# Patient Record
Sex: Female | Born: 1945 | Race: Black or African American | Hispanic: No | Smoking: Current every day smoker
Health system: Southern US, Community
[De-identification: ages and names within clinical notes are randomized; demographics above are authoritative.]

## PROBLEM LIST (undated history)

## (undated) DIAGNOSIS — N289 Disorder of kidney and ureter, unspecified: Secondary | ICD-10-CM

## (undated) DIAGNOSIS — E119 Type 2 diabetes mellitus without complications: Secondary | ICD-10-CM

## (undated) DIAGNOSIS — I10 Essential (primary) hypertension: Secondary | ICD-10-CM

## (undated) DIAGNOSIS — E7800 Pure hypercholesterolemia, unspecified: Secondary | ICD-10-CM

## (undated) HISTORY — PX: CARPAL TUNNEL RELEASE: SHX101

---

## 2014-01-29 ENCOUNTER — Encounter (HOSPITAL_COMMUNITY): Payer: Self-pay | Admitting: Emergency Medicine

## 2014-01-29 ENCOUNTER — Emergency Department (HOSPITAL_COMMUNITY)
Admission: EM | Admit: 2014-01-29 | Discharge: 2014-01-30 | Disposition: A | Discharge status: Home / Self Care | Payer: Federal, State, Local not specified - PPO | Attending: Emergency Medicine | Admitting: Emergency Medicine

## 2014-01-29 DIAGNOSIS — Z720 Tobacco use: Secondary | ICD-10-CM | POA: Diagnosis not present

## 2014-01-29 DIAGNOSIS — I129 Hypertensive chronic kidney disease with stage 1 through stage 4 chronic kidney disease, or unspecified chronic kidney disease: Secondary | ICD-10-CM | POA: Diagnosis not present

## 2014-01-29 DIAGNOSIS — Z79899 Other long term (current) drug therapy: Secondary | ICD-10-CM | POA: Insufficient documentation

## 2014-01-29 DIAGNOSIS — E119 Type 2 diabetes mellitus without complications: Secondary | ICD-10-CM | POA: Diagnosis not present

## 2014-01-29 DIAGNOSIS — N183 Chronic kidney disease, stage 3 (moderate): Secondary | ICD-10-CM | POA: Insufficient documentation

## 2014-01-29 DIAGNOSIS — M25571 Pain in right ankle and joints of right foot: Secondary | ICD-10-CM | POA: Diagnosis not present

## 2014-01-29 DIAGNOSIS — M1000 Idiopathic gout, unspecified site: Secondary | ICD-10-CM | POA: Insufficient documentation

## 2014-01-29 DIAGNOSIS — Z87448 Personal history of other diseases of urinary system: Secondary | ICD-10-CM | POA: Insufficient documentation

## 2014-01-29 DIAGNOSIS — M109 Gout, unspecified: Secondary | ICD-10-CM | POA: Diagnosis present

## 2014-01-29 HISTORY — DX: Essential (primary) hypertension: I10

## 2014-01-29 HISTORY — DX: Pure hypercholesterolemia, unspecified: E78.00

## 2014-01-29 HISTORY — DX: Disorder of kidney and ureter, unspecified: N28.9

## 2014-01-29 HISTORY — DX: Type 2 diabetes mellitus without complications: E11.9

## 2014-01-29 MED ORDER — OXYCODONE-ACETAMINOPHEN 5-325 MG PO TABS
1.0000 | ORAL_TABLET | Freq: Once | ORAL | Status: AC
  Administered 2014-01-29: 1 via ORAL
  Filled 2014-01-29: qty 1

## 2014-01-29 MED ORDER — OXYCODONE-ACETAMINOPHEN 5-325 MG PO TABS: 1.0000 | ORAL_TABLET | Freq: Four times a day (QID) | ORAL | Status: AC | PRN

## 2014-01-29 NOTE — Discharge Instructions (Signed)

## 2014-01-29 NOTE — ED Notes (Signed)
Pt. Reports gout flare up to right foot x3 days. Has taken Colchicine and tramadol with no relief. CNS intact, limited movement due to pain.

## 2014-01-29 NOTE — ED Provider Notes (Signed)
CSN: [id number]     Arrival date & time 01/29/14  2254 History   This chart was scribed for non-physician practitioner working with Mariah Wiley, * by Angelene GiovanniEmmanuella Mensah, ED Scribe. The patient was seen in room TR09C/TR09C and the patient's care was started at 11:32 PM    Chief Complaint  Patient presents with  . Gout   The history is provided by the patient. No language interpreter was used.   HPI Comments: Gar Mariah Wiley is a 68 y.o. female who presents to the Emergency Department complaining of a gout on top of right foot onset 3 days ago. She reports maintain a proper diet and drinking plenty of water and is unsure of what caused this flare up. However, she states that she has been eating lima beans for the past 4 days and suspects that this might be related. She states that she has taken tramadol and colchicine with no relief. She reports a history the same gout last year and was prescribed percocet. She denies any recent falls or injuries to the right ankle. She reports a stage 3 kidney disease.    PCP: Dr. Katrinka BlazingSmith.   Past Medical History  Diagnosis Date  . Diabetes mellitus without complication   . Hypertension   . High cholesterol   . Renal disorder    Past Surgical History  Procedure Laterality Date  . Carpal tunnel release     No family history on file. History  Substance Use Topics  . Smoking status: Current Every Day Smoker -- 0.50 packs/day  . Smokeless tobacco: Not on file  . Alcohol Use: No   OB History   Grav Para Term Preterm Abortions TAB SAB Ect Mult Living                 Review of Systems  Constitutional: Negative for fever and chills.  Musculoskeletal: Positive for myalgias (right foot).      Allergies  Review of patient's allergies indicates no known allergies.  Home Medications   Prior to Admission medications   Not on File   BP 119/61  Pulse 87  Temp(Src) 97.5 F (36.4 C)  Resp 20  SpO2 94% Physical Exam  Nursing note and  vitals reviewed. Constitutional: She is oriented to person, place, and time. She appears well-developed and well-nourished. No distress.  HENT:  Head: Normocephalic and atraumatic.  Eyes: Conjunctivae and EOM are normal.  Neck: Neck supple. No tracheal deviation present.  Cardiovascular: Normal rate.   Intact distal pulses with brisk capillary refill  Pulmonary/Chest: Effort normal. No respiratory distress.  Musculoskeletal: Normal range of motion.  Right foot tender to palpation over the third metatarsal, no bony abnormality or deformity, no erythema, no abscess, no discharge drainage, no evidence of cellulitis, no evidence of septic joint  Neurological: She is alert and oriented to person, place, and time.  Sensation intact  Skin: Skin is warm and dry.  Psychiatric: She has a normal mood and affect. Her behavior is normal.    ED Course  Procedures (including critical care time) DIAGNOSTIC STUDIES: Oxygen Saturation is 94% on RA, adequate by my interpretation.    COORDINATION OF CARE: 11:35 PM- Pt advised of plan for treatment and pt agrees.  Labs Review Labs Reviewed - No data to display  Imaging Review No results found.   EKG Interpretation None      MDM   Final diagnoses:  Acute idiopathic gout, unspecified site    Patient with gout flare. She has had  this pain before. She is tried taking colchicine and tramadol with no relief. She denies any fevers. Denies any injury to the affected area. No evidence of septic joint. Will treat with pain medicine. Continue taking at home medications. Dietary precautions given. Followup with primary care.  I personally performed the services described in this documentation, which was scribed in my presence. The recorded information has been reviewed and is accurate.     Roxy Horsemanobert Annaleah Arata, PA-C 01/30/14 0100

## 2014-01-29 NOTE — ED Notes (Signed)
Pt c/o  Pain in right foot x's 3 days from gout flare up.  Rx's not helping

## 2014-01-30 NOTE — ED Provider Notes (Signed)
Medical screening examination/treatment/procedure(s) were performed by non-physician practitioner and as supervising physician I was immediately available for consultation/collaboration.   EKG Interpretation None        Jyaire Koudelka, MD 01/30/14 0642 

## 2016-03-12 ENCOUNTER — Other Ambulatory Visit: Payer: Self-pay | Admitting: Nurse Practitioner

## 2016-03-12 DIAGNOSIS — E2839 Other primary ovarian failure: Secondary | ICD-10-CM

## 2016-04-02 ENCOUNTER — Ambulatory Visit
Admission: RE | Admit: 2016-04-02 | Discharge: 2016-04-02 | Disposition: A | Discharge status: Home / Self Care | Payer: Federal, State, Local not specified - PPO | Source: Ambulatory Visit | Attending: Nurse Practitioner | Admitting: Nurse Practitioner

## 2016-04-02 DIAGNOSIS — E2839 Other primary ovarian failure: Secondary | ICD-10-CM

## 2016-08-09 ENCOUNTER — Other Ambulatory Visit: Payer: Self-pay | Admitting: Endocrinology

## 2016-08-09 ENCOUNTER — Ambulatory Visit
Admission: RE | Admit: 2016-08-09 | Discharge: 2016-08-09 | Disposition: A | Discharge status: Home / Self Care | Payer: Self-pay | Source: Ambulatory Visit | Attending: Endocrinology | Admitting: Endocrinology

## 2016-08-09 DIAGNOSIS — E042 Nontoxic multinodular goiter: Secondary | ICD-10-CM

## 2016-08-15 ENCOUNTER — Ambulatory Visit
Admission: RE | Admit: 2016-08-15 | Discharge: 2016-08-15 | Disposition: A | Discharge status: Home / Self Care | Payer: Federal, State, Local not specified - PPO | Source: Ambulatory Visit | Attending: Endocrinology | Admitting: Endocrinology

## 2016-08-15 ENCOUNTER — Other Ambulatory Visit (HOSPITAL_COMMUNITY)
Admission: RE | Admit: 2016-08-15 | Discharge: 2016-08-15 | Disposition: A | Discharge status: Home / Self Care | Payer: Federal, State, Local not specified - PPO | Source: Ambulatory Visit | Attending: Radiology | Admitting: Radiology

## 2016-08-15 DIAGNOSIS — E042 Nontoxic multinodular goiter: Secondary | ICD-10-CM

## 2016-08-15 DIAGNOSIS — E041 Nontoxic single thyroid nodule: Secondary | ICD-10-CM | POA: Diagnosis present

## 2017-09-25 IMAGING — US US OUTSIDE FILMS SOFT TISSUE NECK
1 series · 14 of 25 positions shown · non-contrast
Comparison: none

[Series 1: us outside films soft tissue neck · 14 of 57 slices shown]
[im 1/57]
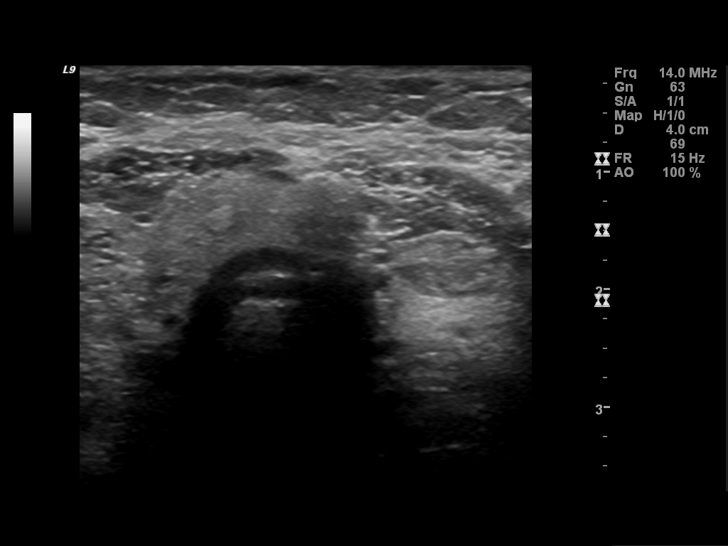
[im 5/57]
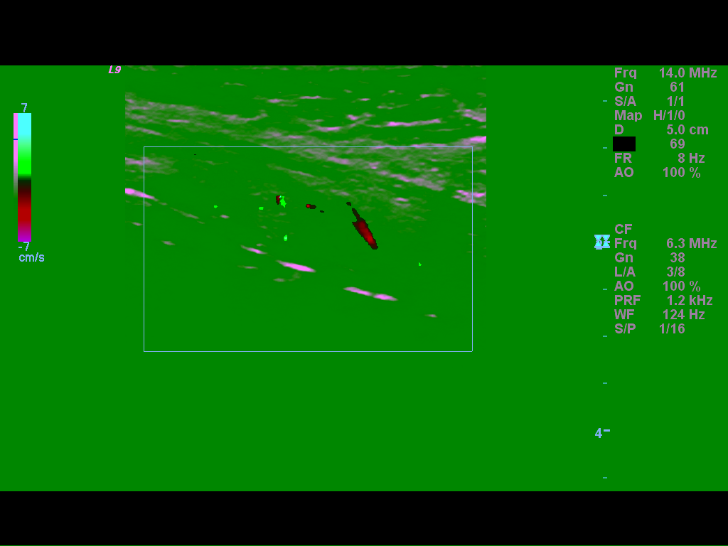
[im 10/57]
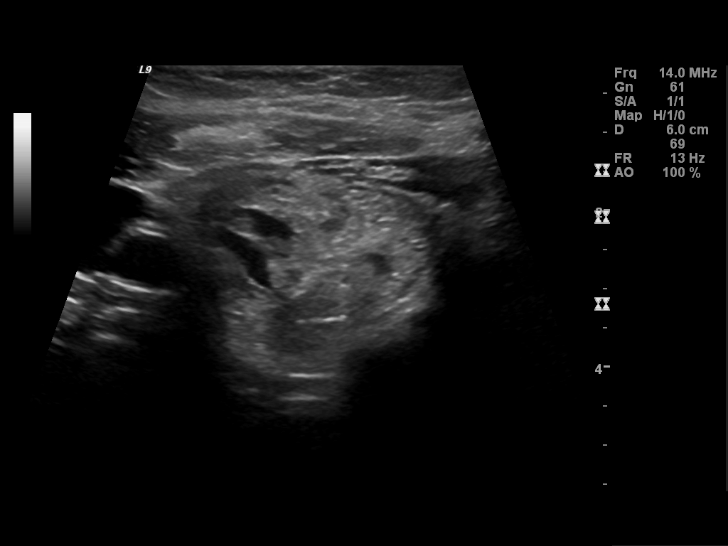
[im 15/57]
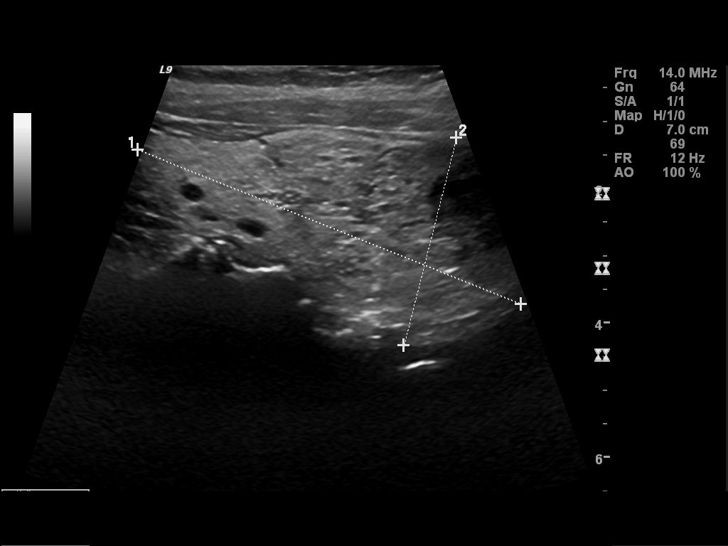
[im 19/57]
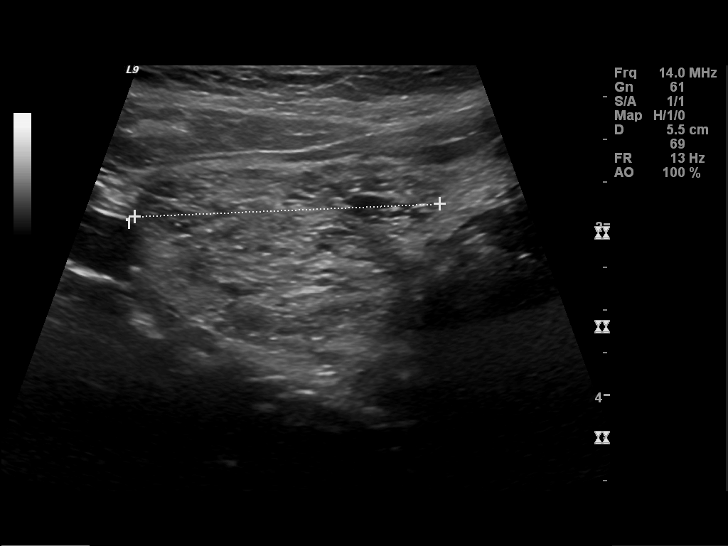
[im 22/57]
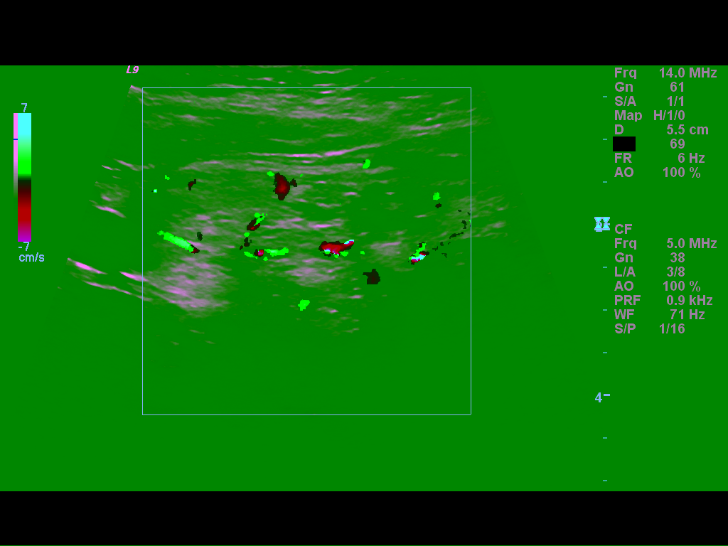
[im 26/57]
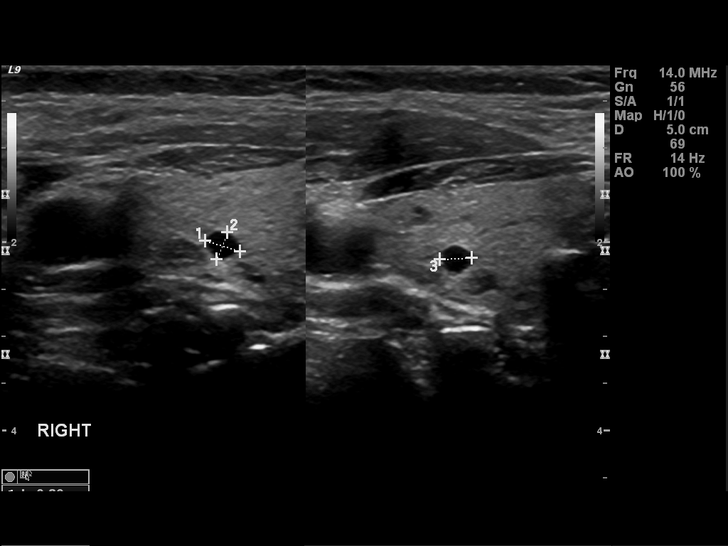
[im 31/57]
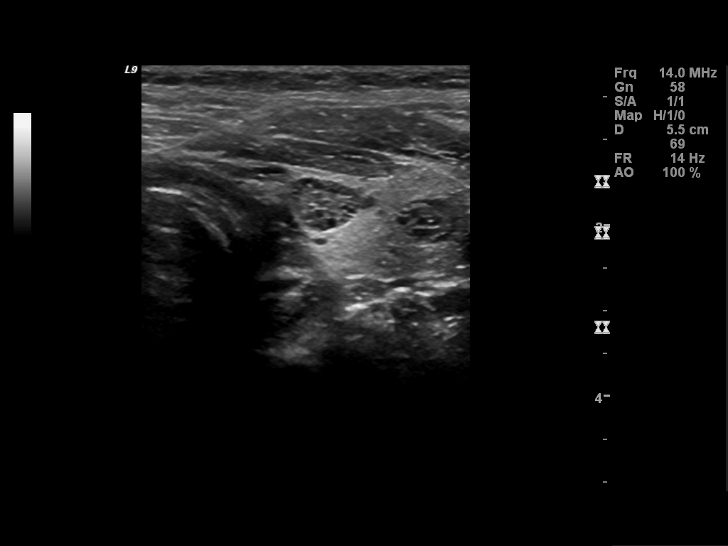
[im 36/57]
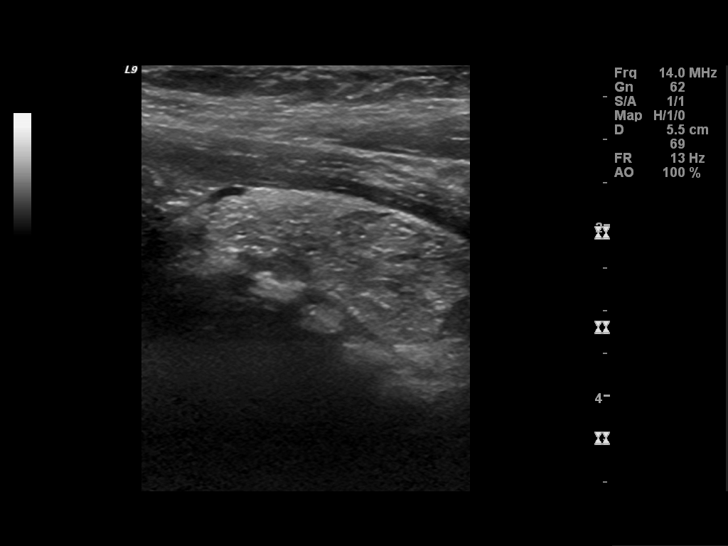
[im 38/57]
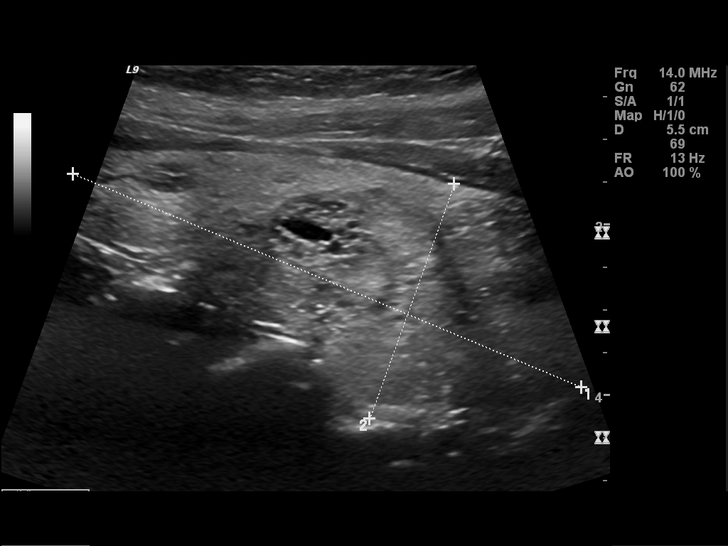
[im 43/57]
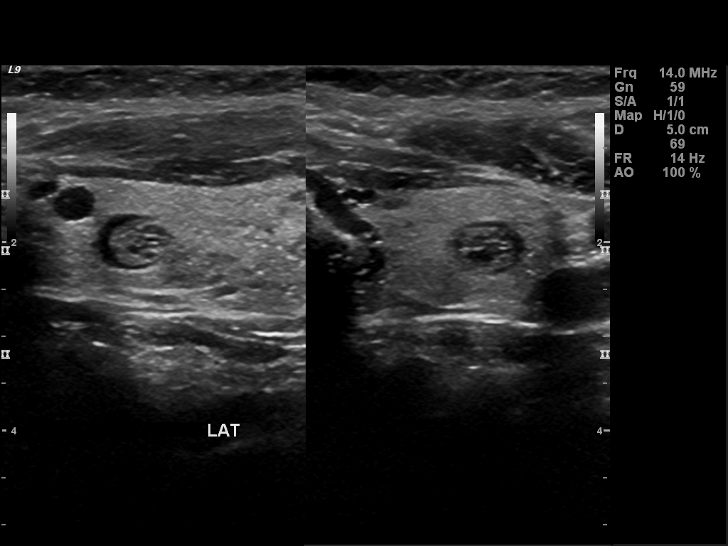
[im 47/57]
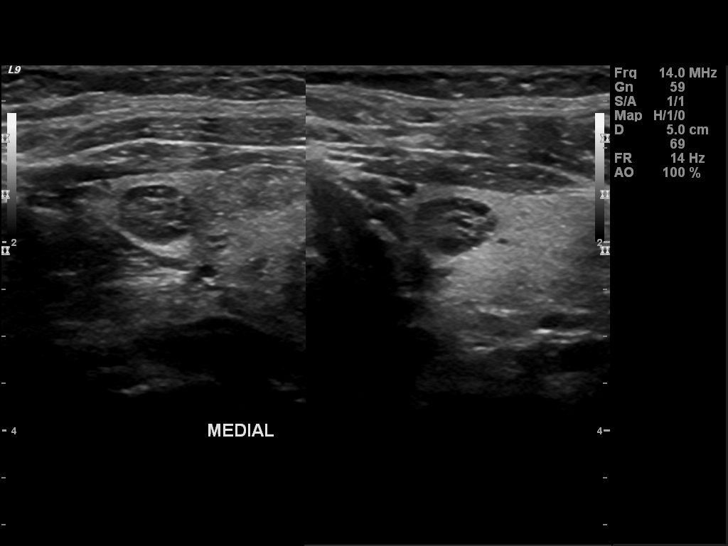
[im 52/57]
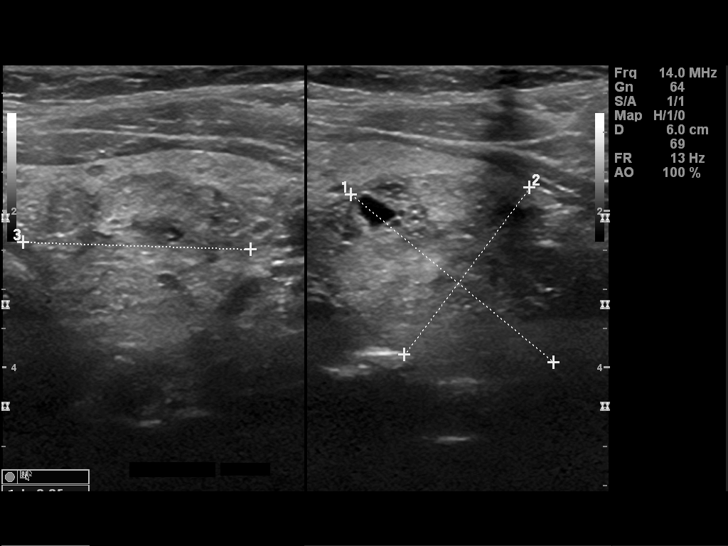
[im 57/57]
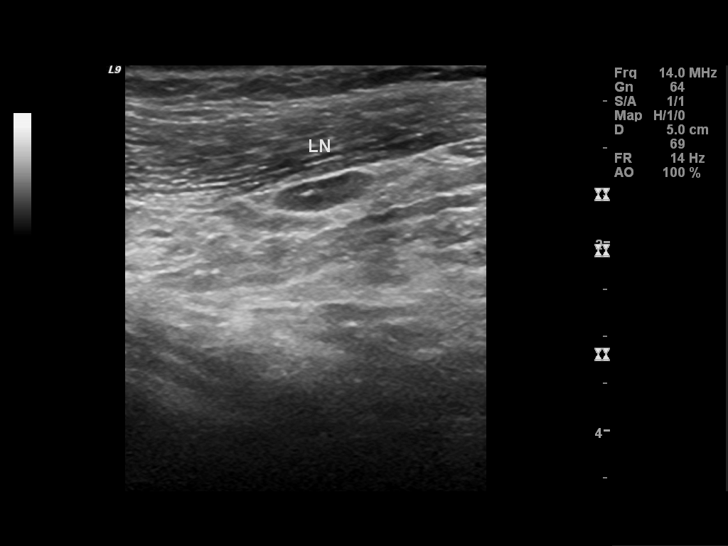

[14 of 25 positions shown; findings below may reference images not displayed]

Canned report from images found in remote index.

Refer to host system for actual result text.

## 2018-04-16 IMAGING — US US THYROID BIOPSY
1 series · 13 of 20 positions shown · non-contrast
Comparison: none

INDICATION: Patient with history of multinodular goiter and thyroid ultrasound
at [HOSPITAL] on 06/20/2016 which revealed dominant nodules in
both right and left lobes. There is a 4 cm nodule in the right lower
pole and a 3.4 cm nodule in the left lower pole . Request now
received for needle aspirate biopsies of the above mentioned
dominant thyroid nodules.

EXAM:
ULTRASOUND GUIDED FINE NEEDLE ASPIRATION BIOPSY OF DOMINANT RIGHT
LOWER POLE AND LEFT LOWER POLE THYROID NODULES
MEDICATIONS:
None.
ANESTHESIA/SEDATION:
None
FLUOROSCOPY TIME:  None
COMPLICATIONS:
None immediate.
TECHNIQUE: Informed written consent was obtained from the patient after a
discussion of the risks, benefits and alternatives to treatment.
Questions regarding the procedure were encouraged and answered. A
timeout was performed prior to the initiation of the procedure.

[Series 1: us thyroid biopsy · 0.08mm/px · 20 acquisitions, 13 frames shown]
[im 1/20]
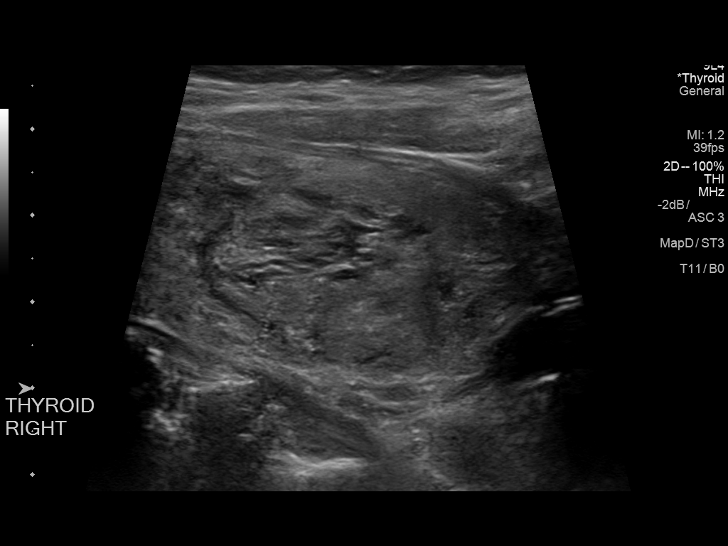
[im 3/20]
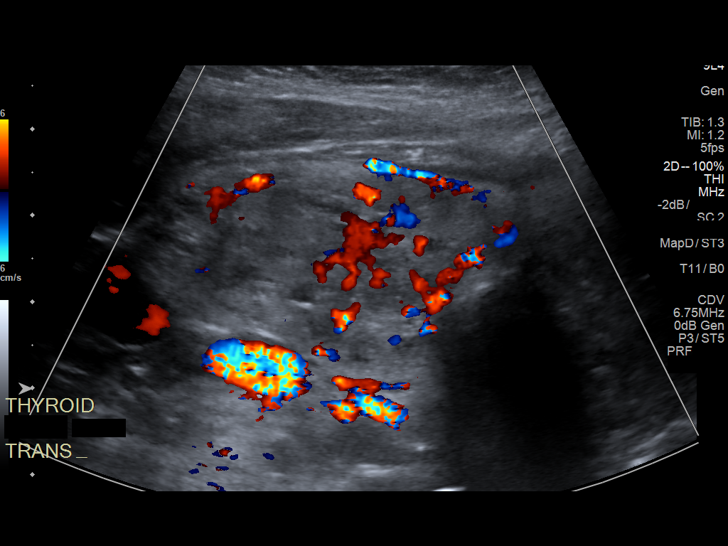
[im 4/20]
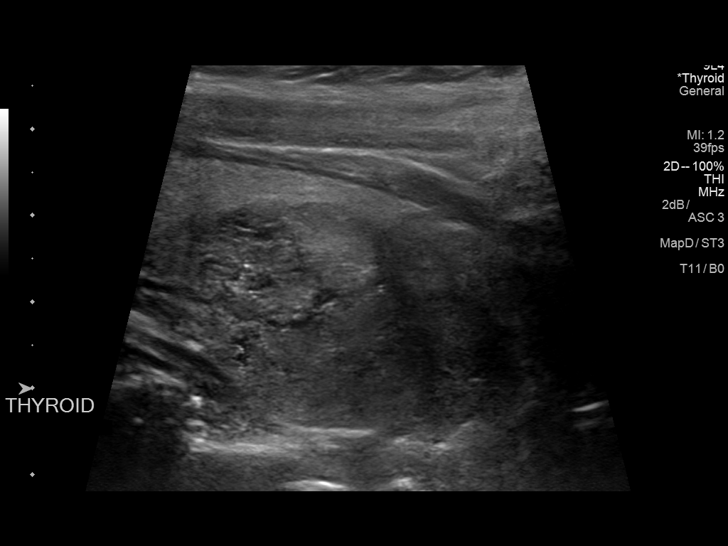
[im 6/20]
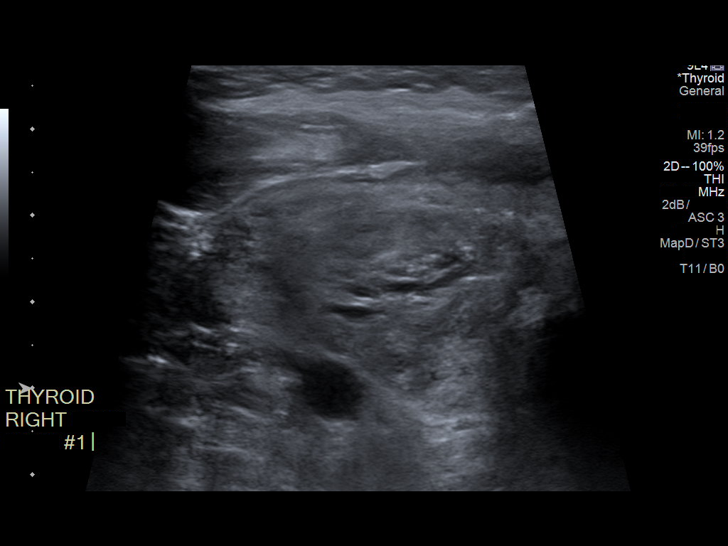
[im 7/20]
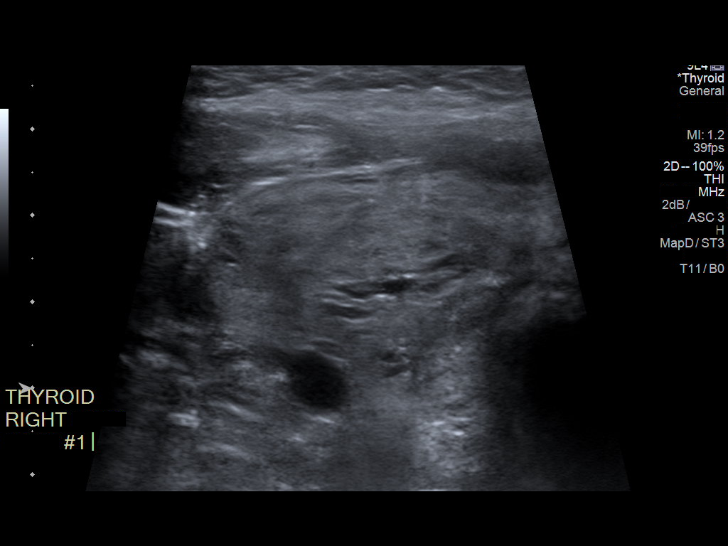
[im 9/20]
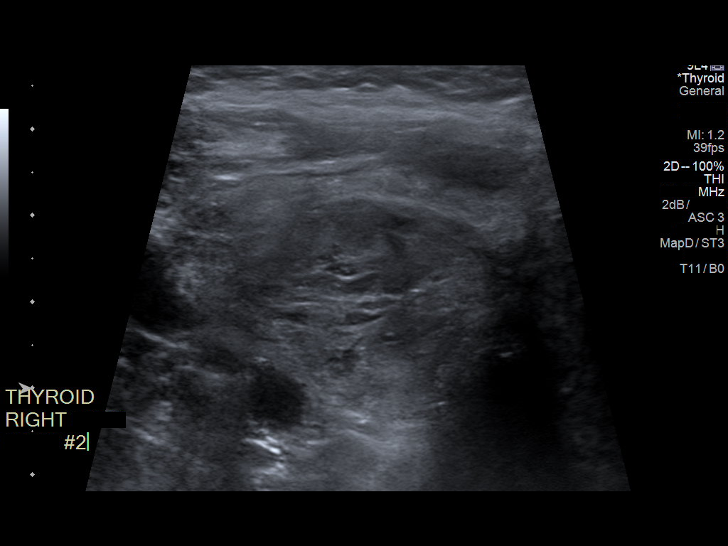
[im 11/20]
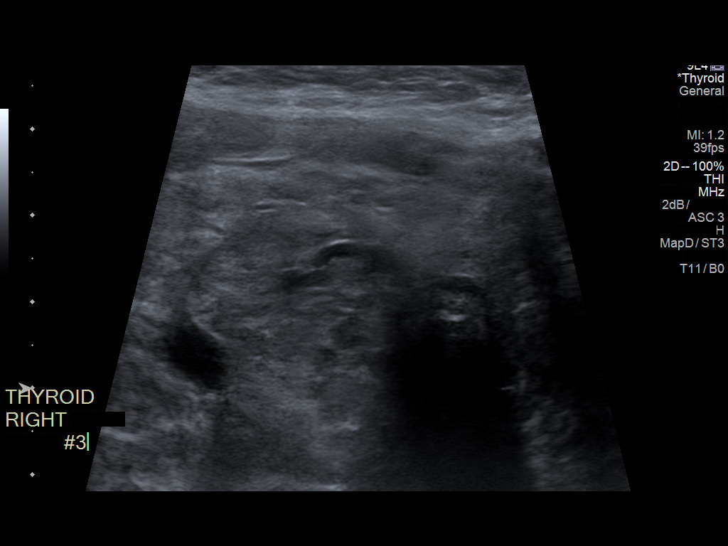
[im 12/20]
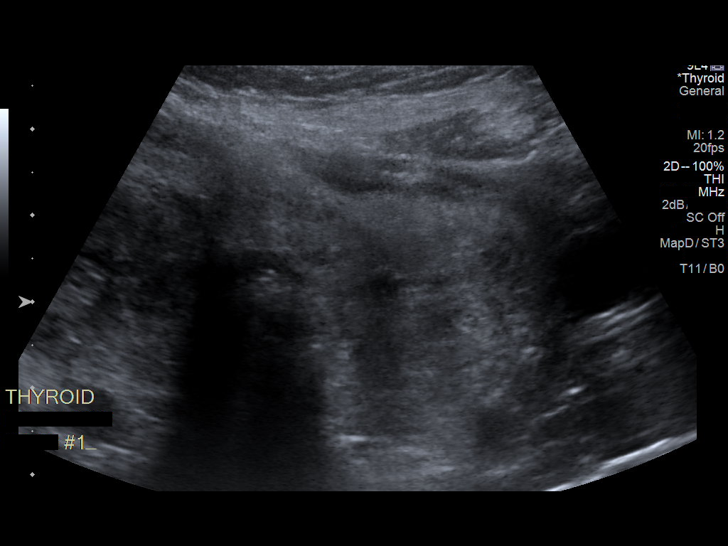
[im 14/20]
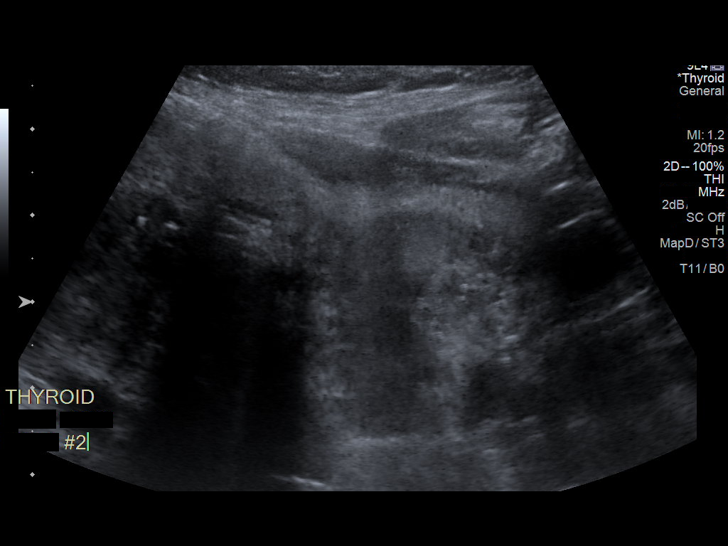
[im 15/20]
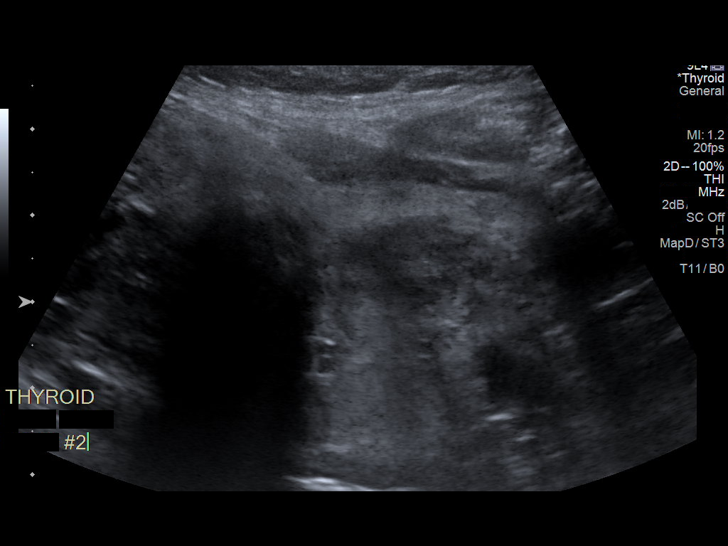
[im 17/20]
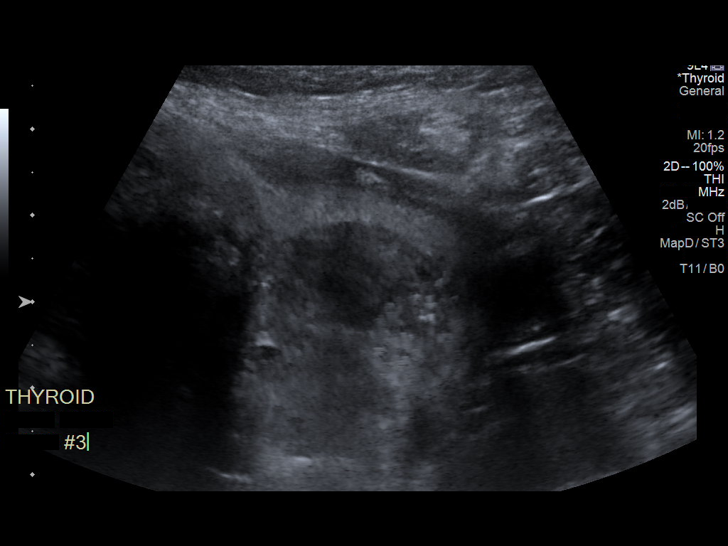
[im 18/20]
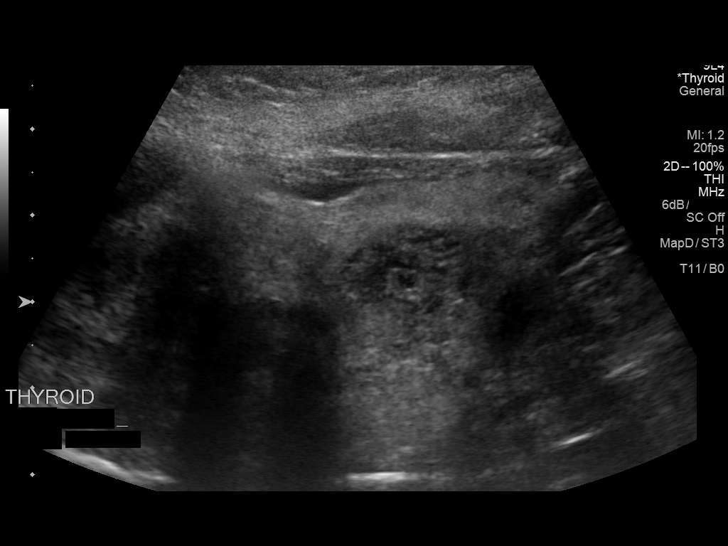
[im 20/20]
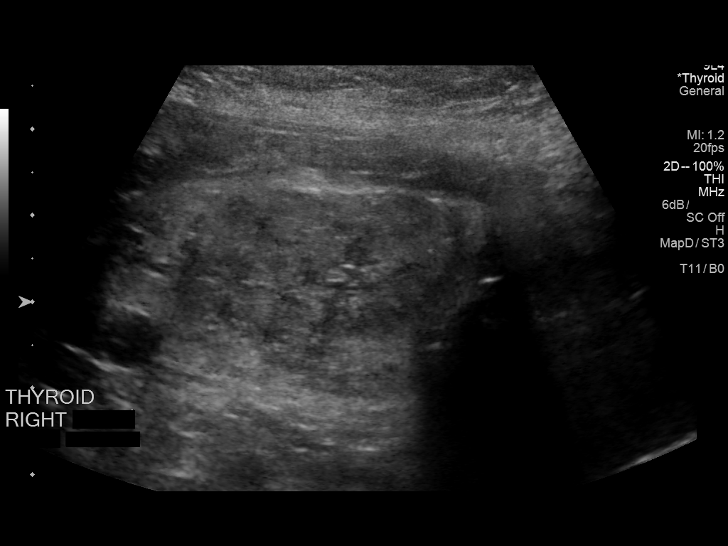

[13 of 20 positions shown; findings below may reference images not displayed]

Pre-procedural ultrasound scanning demonstrated bilateral thyroid
nodules

The procedures were planned. The neck was prepped in the usual
sterile fashion, and a sterile drape was applied covering the
operative field. A timeout was performed prior to the initiation of
the procedure. Local anesthesia was provided with 1% lidocaine.

Under direct ultrasound guidance, 3 FNA biopsies were performed of
the dominant right lower pole thyroid nodule with 25 gauge needles.
The samples were prepared and submitted to pathology.

Under direct ultrasound guidance, 3 FNA biopsies were performed of
the dominant left lower pole thyroid nodule with 25 gauge needles.
The samples were prepared and submitted to pathology.

Limited post procedural scanning was negative for hematoma or
additional complication. Dressings were placed. The patient
tolerated the above procedures procedure well without immediate
postprocedural complication.
IMPRESSION: Technically successful ultrasound guided fine needle aspiration
biopsies of dominant right lower pole and left lower pole thyroid
nodules. Final pathology pending.

## 2019-09-22 ENCOUNTER — Ambulatory Visit: Payer: Federal, State, Local not specified - PPO | Attending: Nurse Practitioner | Admitting: Physical Therapy

## 2019-09-22 ENCOUNTER — Encounter: Payer: Self-pay | Admitting: Physical Therapy

## 2019-09-22 ENCOUNTER — Other Ambulatory Visit: Payer: Self-pay

## 2019-09-22 DIAGNOSIS — M6281 Muscle weakness (generalized): Secondary | ICD-10-CM | POA: Diagnosis present

## 2019-09-22 DIAGNOSIS — G8929 Other chronic pain: Secondary | ICD-10-CM | POA: Insufficient documentation

## 2019-09-22 DIAGNOSIS — M25652 Stiffness of left hip, not elsewhere classified: Secondary | ICD-10-CM | POA: Diagnosis present

## 2019-09-22 DIAGNOSIS — M5442 Lumbago with sciatica, left side: Secondary | ICD-10-CM | POA: Insufficient documentation

## 2019-09-22 DIAGNOSIS — R2689 Other abnormalities of gait and mobility: Secondary | ICD-10-CM | POA: Diagnosis present

## 2019-09-22 NOTE — Patient Instructions (Signed)
Access Code: [email or web address] URL: [email or web address] Date: 09/22/2019 Prepared by: Vernon Prey April Kirstie Peri  Exercises Seated Heel Slide - 1 x daily - 7 x weekly - 2 sets - 10 reps Seated Transversus Abdominis Bracing - 1 x daily - 7 x weekly - 2 sets - 10 reps

## 2019-09-22 NOTE — Therapy (Signed)
[facility] [address] [place], [place], [address] Phone: [phone]   Fax:  [phone]  Physical Therapy Evaluation  Patient Details  Name: Mariah Wiley MRN: [id number] Date of Birth: 1945-08-16 Referring Provider (PT): Maudie Flakes, FNP   Encounter Date: 09/22/2019  PT End of Session - 09/22/19 1705    Visit Number  1    Number of Visits  16    Date for PT Re-Evaluation  11/17/19    PT Start Time  1620    PT Stop Time  1712    PT Time Calculation (min)  52 min       Past Medical History:  Diagnosis Date  . Diabetes mellitus without complication (HCC)   . High cholesterol   . Hypertension   . Renal disorder     Past Surgical History:  Procedure Laterality Date  . CARPAL TUNNEL RELEASE      There were no vitals filed for this visit.   Subjective Assessment - 09/22/19 1621    Subjective  Pt reports she went to spine specialist and was told she had sciatica, arthritis and DDD. Since seeing PCP pt has had bad L side sciatica. Difficulty getting out of bed to the bathroom. Pt states it has calmed down some but it's still there. Pt reports bilateral LE cramping. Pt states spine specialist adjusted pain medication to a muscle relaxer and pt reports that it has been helping. Increased pain with rain. 10/10 pain at it's worst, 4/10 pain at it's best. Pt states she has had to rely on using the back brace for community mobility.    Limitations  Sitting;Standing;Walking;House hold activities    How long can you sit comfortably?  15 minutes    How long can you stand comfortably?  30 sec    How long can you walk comfortably?  100'    Patient Stated Goals  Decrease pain    Currently in Pain?  Yes    Pain Score  5     Pain Location  Back    Pain Orientation  Left    Pain Descriptors / Indicators  Spasm;Squeezing;Shooting;Constant    Pain Type  Chronic pain    Pain Radiating Towards  Left leg    Pain Onset  More than a  month ago    Pain Frequency  Constant    Aggravating Factors   Rain    Pain Relieving Factors  Medication, hot shower         [facility] PT Assessment - 09/22/19 0001      Assessment   Medical Diagnosis  M54.42 (ICD-10-CM) - Lumbago with sciatica, left side    Referring Provider (PT)  Maudie Flakes, FNP    Prior Therapy  Carpal tunnel      Balance Screen   Has the patient fallen in the past 6 months  Yes    How many times?  2   3 months ago and two weeks ago   Has the patient had a decrease in activity level because of a fear of falling?   Yes      Home Environment   Living Environment  Private residence    Living Arrangements  --   Great grandson   Available Help at Discharge  Family    Type of Home  Apartment    Home Access  Level entry    Home Layout  One level    Home Equipment  [place] - single point  Rollator     Prior Function   Level of Independence  Independent with household mobility with device    Vocation  Retired      Metallurgist   Posture/Postural Control  Postural limitations    Postural Limitations  Rounded Shoulders;Forward head;Decreased lumbar lordosis;Flexed trunk;Weight shift right      AROM   Right Hip Flexion  100    Left Hip Flexion  --   Unable to lift hip in seated position   Lumbar Flexion  ~50% limitatoin    Lumbar Extension  ~50% limitation    Lumbar - Right Side Bend  ~60% limitation   limited due to bone   Lumbar - Left Side Bend  ~50% limitation   reports feeling in bone     Strength   Right Hip Flexion  3+/5    Right Hip ABduction  4-/5    Right Hip ADduction  3+/5    Left Hip Flexion  2-/5    Left Hip ABduction  3+/5    Left Hip ADduction  3+/5    Right Knee Flexion  4+/5    Right Knee Extension  4+/5    Left Knee Flexion  3-/5   Limited due to stretch in hamstring/pain   Left Knee Extension  3-/5      Palpation   Palpation comment  TTP hamstring, piriformis and quadratus lumborum                   Objective measurements completed on examination: See above findings.      [facility] Adult PT Treatment/Exercise - 09/22/19 0001      Lumbar Exercises: Stretches   Other Lumbar Stretch Exercise  Heel slide 2 sets x 5 reps   Limited due to pt hamstring pain; assist with knee flexion   Other Lumbar Stretch Exercise  Sitting child's pose      Lumbar Exercises: Seated   Other Seated Lumbar Exercises  TSA activation in seated x 5 reps   reports burn in low back with this exercise     Modalities   Modalities  Electrical Stimulation      Electrical Stimulation   Electrical Stimulation Location  Left low back    Electrical Stimulation Action  IFC    Electrical Stimulation Parameters  to pt tolerance    Electrical Stimulation Goals  Tone;Pain      Manual Therapy   Manual Therapy  Soft tissue mobilization    Soft tissue mobilization  Piriformis and quadratus lumborum             PT Education - 09/22/19 1828    Education Details  Discussed with pt that due to pain, pt with increased guarding and decreased use of L side resulting in increased weakness and pain. Discussed with pt weaning off of back brace slowly for increased use of her lumbar stabilizers.    Person(s) Educated  Patient;Other (comment)   grandchild   Methods  Explanation;Demonstration;Handout    Comprehension  Verbalized understanding;Returned demonstration;Tactile cues required;Verbal cues required;Need further instruction       PT Short Term Goals - 09/22/19 1836      PT SHORT TERM GOAL #1   Title  Pt will be able to complete heel slide with full ROM and no pain    Baseline  Knee ROM 30 to 100 deg without pain    Time  4    Period  Weeks    Status  New    Target  Date  11/17/19        PT Long Term Goals - 09/22/19 1838      PT LONG TERM GOAL #1   Title  Pt will be able to tolerate standing >/= 5 minutes without pain    Baseline  Only tolerates 30 sec    Time  8    Period   Weeks    Status  New    Target Date  11/17/19      PT LONG TERM GOAL #2   Title  Pt will be able to amb >/=200' with rollator without pain    Baseline  Only tolerates ~100'      PT LONG TERM GOAL #3   Title  Pt will be able to sweep her floor for 5 minutes without pain    Baseline  Unable    Time  8    Period  Weeks    Status  New    Target Date  11/17/19      PT LONG TERM GOAL #4   Title  Pt will have improved bilateral hip strength and ROM to at least [facility] for improved ADLs    Baseline  L LE grossly 3- to 3+/5    Time  8    Period  Weeks    Status  New    Target Date  11/17/19      PT LONG TERM GOAL #5   Title  Pt will be independent with her HEP    Time  8    Period  Weeks    Status  New    Target Date  11/17/19             Plan - 09/22/19 1830    Clinical Impression Statement  Pt is a 74 y/o F presenting to OPPT with left side low back pain radiating to left leg. Pt identifies pain and tenderness of hamstring, quadratus lumborum, L piriformis/glute, decreased hip ROM and strength (L worse than R), decreased lumbar ROM with pain, weak core, decreased activity tolerance, and gait dysfunction affecting pt's ability to perform ADLs, IADLs, and community amb. Pt highly reactive with heavy guarding. Pt would benefit from therapy to address these issues to optimize level of function and reduce fall risk.    Personal Factors and Comorbidities  Age;Fitness;Comorbidity 1;Past/Current Experience    Comorbidities  arthritis    Examination-Activity Limitations  Bed Mobility;Bend;Caring for Others;Carry;Dressing;Lift;Locomotion Level;Sit;Squat;Stairs;Stand    Examination-Participation Restrictions  Cleaning;Community Activity;Laundry;Shop;Meal Prep    Stability/Clinical Decision Making  Evolving/Moderate complexity    Clinical Decision Making  Moderate    Rehab Potential  Fair    PT Frequency  2x / week    PT Duration  8 weeks    PT Treatment/Interventions  ADLs/Self Care Home  Management;Aquatic Therapy;Cryotherapy;Electrical Stimulation;Iontophoresis 4mg /ml Dexamethasone;Moist Heat;Ultrasound;Gait training;Stair training;Functional mobility training;Therapeutic activities;Therapeutic exercise;Balance training;Neuromuscular re-education;Patient/family education;Manual techniques;Passive range of motion;Taping    PT Next Visit Plan  Assess pt response to HEP. Continue to progress pt's stretching and strengthening of low back and hip as able. E-stim as needed. May highly benefit from dry needling.    PT Home Exercise Plan  Access Code: [email or web address]    Consulted and Agree with Plan of Care  Patient       Patient will benefit from skilled therapeutic intervention in order to improve the following deficits and impairments:  Abnormal gait, Decreased range of motion, Difficulty walking, Increased fascial restricitons, Decreased endurance, Obesity, Decreased activity tolerance, Pain, Decreased balance, Hypomobility,  Impaired flexibility, Improper body mechanics, Postural dysfunction, Decreased strength, Decreased mobility  Visit Diagnosis: Chronic left-sided low back pain with left-sided sciatica  Muscle weakness (generalized)  Stiffness of left hip, not elsewhere classified  Other abnormalities of gait and mobility     Problem List There are no problems to display for this patient.   [facility] [address] PT, DPT 09/22/2019, 6:48 PM  [facility] [address] [place], [place], [address] Phone: [phone]   Fax:  [phone]  Name: Mariah Wiley MRN: [id number] Date of Birth: Nov 18, 1945

## 2019-09-30 ENCOUNTER — Encounter: Payer: Self-pay | Admitting: Rehabilitative and Restorative Service Providers"

## 2019-09-30 ENCOUNTER — Other Ambulatory Visit: Payer: Self-pay

## 2019-09-30 ENCOUNTER — Ambulatory Visit: Payer: Federal, State, Local not specified - PPO | Admitting: Rehabilitative and Restorative Service Providers"

## 2019-09-30 DIAGNOSIS — R2689 Other abnormalities of gait and mobility: Secondary | ICD-10-CM

## 2019-09-30 DIAGNOSIS — M5442 Lumbago with sciatica, left side: Secondary | ICD-10-CM | POA: Diagnosis not present

## 2019-09-30 DIAGNOSIS — M6281 Muscle weakness (generalized): Secondary | ICD-10-CM

## 2019-09-30 DIAGNOSIS — M25652 Stiffness of left hip, not elsewhere classified: Secondary | ICD-10-CM

## 2019-09-30 NOTE — Therapy (Signed)
[facility] [place], [place], [address] Phone: [phone]   Fax:  [phone]  Physical Therapy Treatment  Patient Details  Name: Mariah Wiley MRN: [id number] Date of Birth: 1946-02-08 Referring Provider (PT): Gregor Hams, FNP   Encounter Date: 09/30/2019   PT End of Session - 09/30/19 1737    Visit Number 2    Number of Visits 16    Date for PT Re-Evaluation 11/17/19    PT Start Time 0505    PT Stop Time 0552    PT Time Calculation (min) 47 min    Activity Tolerance Patient tolerated treatment well;Patient limited by pain    Behavior During Therapy [facility] for tasks assessed/performed           Past Medical History:  Diagnosis Date  . Diabetes mellitus without complication ([facility])   . High cholesterol   . Hypertension   . Renal disorder     Past Surgical History:  Procedure Laterality Date  . CARPAL TUNNEL RELEASE      There were no vitals filed for this visit.   Subjective Assessment - 09/30/19 1709    Subjective I took a muscle relaxer and pain meds around 1230. I am now a 3/10 after the medicine. Radiates down to the knee everytime.    Limitations Sitting;Standing;Walking;House hold activities    How long can you sit comfortably? 15 minutes    How long can you stand comfortably? 30 sec    How long can you walk comfortably? 100'    Patient Stated Goals Decrease pain    Currently in Pain? Yes    Pain Score 3     Pain Location Back    Pain Orientation Left    Pain Descriptors / Indicators Squeezing;Constant    Pain Type Chronic pain    Pain Radiating Towards left leg    Pain Onset More than a month ago    Pain Frequency Constant    Aggravating Factors  weather    Pain Relieving Factors meds, sidelying    Multiple Pain Sites No                             [facility] Adult PT Treatment/Exercise - 09/30/19 0001      Lumbar Exercises: Seated   Other Seated Lumbar Exercises TSA  activation: with march x 12, with LAQ unilat and bil x 10, seated clam shell x 15, with bil shoulder flex/ext x 15; review HEP; with horiz abdct/adduct bil UEs x 15; with forward flexion x 15      Lumbar Exercises: Supine   Other Supine Lumbar Exercises lower trunk rotation limited range of motion x 5 each direction; pt had difficulty transitioning out of and remaining supine; pt requrired max assist to transfer from position      Lumbar Exercises: Sidelying   Other Sidelying Lumbar Exercises sidelying pelvic tilt due to pt stating she prefers sidelying 2x10      Electrical Stimulation   Electrical Stimulation Location Left low back    Electrical Stimulation Action IFC    Electrical Stimulation Parameters to pt tolerance (41.0 cV)    Electrical Stimulation Goals Pain   performed during some of therex                   PT Short Term Goals - 09/22/19 1836      PT SHORT TERM GOAL #1   Title Pt  will be able to complete heel slide with full ROM and no pain    Baseline Knee ROM 30 to 100 deg without pain    Time 4    Period Weeks    Status New    Target Date 11/17/19             PT Long Term Goals - 09/22/19 1838      PT LONG TERM GOAL #1   Title Pt will be able to tolerate standing >/= 5 minutes without pain    Baseline Only tolerates 30 sec    Time 8    Period Weeks    Status New    Target Date 11/17/19      PT LONG TERM GOAL #2   Title Pt will be able to amb >/=200' with rollator without pain    Baseline Only tolerates ~100'      PT LONG TERM GOAL #3   Title Pt will be able to sweep her floor for 5 minutes without pain    Baseline Unable    Time 8    Period Weeks    Status New    Target Date 11/17/19      PT LONG TERM GOAL #4   Title Pt will have improved bilateral hip strength and ROM to at least [facility] for improved ADLs    Baseline L LE grossly 3- to 3+/5    Time 8    Period Weeks    Status New    Target Date 11/17/19      PT LONG TERM GOAL #5    Title Pt will be independent with her HEP    Time 8    Period Weeks    Status New    Target Date 11/17/19                 Plan - 09/30/19 1737    Clinical Impression Statement Pt is limited by pain and hindered in positioning due to being unable to lay supine and difficulty with transitioning from supine to sidelying. Pt was able to tolerate activities better in sidelying and sitting. Therapy focus on maintaining abdominal bracing while performing activities for further challenge. Pt would benefit from further PT for core stability activities and flexibility exercises as tolerated. Her pain and high guarding with activities hinders participation.    PT Frequency 2x / week    PT Duration 8 weeks    PT Treatment/Interventions ADLs/Self Care Home Management;Aquatic Therapy;Cryotherapy;Electrical Stimulation;Iontophoresis 4mg /ml Dexamethasone;Moist Heat;Ultrasound;Gait training;Stair training;Functional mobility training;Therapeutic activities;Therapeutic exercise;Balance training;Neuromuscular re-education;Patient/family education;Manual techniques;Passive range of motion;Taping    PT Next Visit Plan Continue to progress pt's stretching and strengthening of low back and hip as able. E-stim as needed. May highly benefit from dry needling.    PT Home Exercise Plan Access Code: [email or web address]           Patient will benefit from skilled therapeutic intervention in order to improve the following deficits and impairments:  Abnormal gait, Decreased range of motion, Difficulty walking, Increased fascial restricitons, Decreased endurance, Obesity, Decreased activity tolerance, Pain, Decreased balance, Hypomobility, Impaired flexibility, Improper body mechanics, Postural dysfunction, Decreased strength, Decreased mobility  Visit Diagnosis: Chronic left-sided low back pain with left-sided sciatica  Muscle weakness (generalized)  Stiffness of left hip, not elsewhere classified  Other abnormalities of  gait and mobility     Problem List There are no problems to display for this patient.   , PT 09/30/2019, 5:58 PM  [facility] [address] [place], [place], [address] Phone: [phone]   Fax:  [phone]  Name: Mariah Wiley MRN: [id number] Date of Birth: 1945-10-05

## 2019-10-06 ENCOUNTER — Ambulatory Visit: Payer: Federal, State, Local not specified - PPO | Admitting: Physical Therapy

## 2019-10-06 ENCOUNTER — Other Ambulatory Visit: Payer: Self-pay

## 2019-10-06 DIAGNOSIS — R2689 Other abnormalities of gait and mobility: Secondary | ICD-10-CM

## 2019-10-06 DIAGNOSIS — M5442 Lumbago with sciatica, left side: Secondary | ICD-10-CM | POA: Diagnosis not present

## 2019-10-06 DIAGNOSIS — M6281 Muscle weakness (generalized): Secondary | ICD-10-CM

## 2019-10-06 DIAGNOSIS — M25652 Stiffness of left hip, not elsewhere classified: Secondary | ICD-10-CM

## 2019-10-06 NOTE — Therapy (Signed)
[facility] [address] [place], [place], [address] Phone: [phone]   Fax:  [phone]  Physical Therapy Treatment  Patient Details  Name: Mariah Wiley MRN: [id number] Date of Birth: Sep 10, 1945 Referring Provider (PT): Maudie Flakes, FNP   Encounter Date: 10/06/2019   PT End of Session - 10/06/19 1429    Visit Number 3    Number of Visits 16    Date for PT Re-Evaluation 11/17/19    PT Start Time 1410    PT Stop Time 1438    PT Time Calculation (min) 28 min    Activity Tolerance Patient tolerated treatment well;Patient limited by pain    Behavior During Therapy [facility] for tasks assessed/performed           Past Medical History:  Diagnosis Date  . Diabetes mellitus without complication (HCC)   . High cholesterol   . Hypertension   . Renal disorder     Past Surgical History:  Procedure Laterality Date  . CARPAL TUNNEL RELEASE      There were no vitals filed for this visit.   Subjective Assessment - 10/06/19 1416    Subjective Pt reports increased soreness from doing exercises in the pool. Pt refuses any treatment besides using e-stim. Pt refuses to be touched for any manual therapy and refuses gentle stretching. Pt agreeable to use up a visit for e-stim.    Limitations Sitting;Standing;Walking;House hold activities    How long can you sit comfortably? 15 minutes    How long can you stand comfortably? 30 sec    How long can you walk comfortably? 100'    Patient Stated Goals Decrease pain    Currently in Pain? Yes    Pain Score 9     Pain Location Back    Pain Onset More than a month ago                             [facility] Adult PT Treatment/Exercise - 10/06/19 0001      Modalities   Modalities Iontophoresis      Electrical Stimulation   Electrical Stimulation Location Low back and Lt glute    Electrical Stimulation Action premod    Electrical Stimulation Parameters to pt tolerance     Electrical Stimulation Goals Pain;Tone      Iontophoresis   Type of Iontophoresis Dexamethasone    Location low back    Dose 80 mA/min    Time 4 hrs                  PT Education - 10/06/19 1427    Education Details Discussed with pt that e-stim is not a very skilled procedure and discussed home TENs unit. Discussed with pt concerns about using up a full visit just for e-stim and that it would be okay to cancel next time if she is this sore.    Person(s) Educated Patient    Methods Explanation;Demonstration;Handout    Comprehension Verbalized understanding;Returned demonstration;Tactile cues required            PT Short Term Goals - 09/22/19 1836      PT SHORT TERM GOAL #1   Title Pt will be able to complete heel slide with full ROM and no pain    Baseline Knee ROM 30 to 100 deg without pain    Time 4    Period Weeks    Status New    Target  Date 11/17/19             PT Long Term Goals - 09/22/19 1838      PT LONG TERM GOAL #1   Title Pt will be able to tolerate standing >/= 5 minutes without pain    Baseline Only tolerates 30 sec    Time 8    Period Weeks    Status New    Target Date 11/17/19      PT LONG TERM GOAL #2   Title Pt will be able to amb >/=200' with rollator without pain    Baseline Only tolerates ~100'      PT LONG TERM GOAL #3   Title Pt will be able to sweep her floor for 5 minutes without pain    Baseline Unable    Time 8    Period Weeks    Status New    Target Date 11/17/19      PT LONG TERM GOAL #4   Title Pt will have improved bilateral hip strength and ROM to at least [facility] for improved ADLs    Baseline L LE grossly 3- to 3+/5    Time 8    Period Weeks    Status New    Target Date 11/17/19      PT LONG TERM GOAL #5   Title Pt will be independent with her HEP    Time 8    Period Weeks    Status New    Target Date 11/17/19                 Plan - 10/06/19 1440    Clinical Impression Statement Pt presents to OPPT  with increased soreness this session from going to the pool. Pt notes that she did her HEP and stretches last night and refuses any other treatment this session besides e-stim and ionto. Pt provided education for TENs unit at home and discussed that passive treatments does not best utilize her therapy visits -- pt verbalizes understanding but would still like to use a therapy visit. Further discussed with pt, aquatic therapy and pt expresses interest in this. Pt seems very appropriate for this and would be highly beneficial for her.    Personal Factors and Comorbidities Age;Fitness;Comorbidity 1;Past/Current Experience    Comorbidities arthritis    Examination-Activity Limitations Bed Mobility;Bend;Caring for Others;Carry;Dressing;Lift;Locomotion Level;Sit;Squat;Stairs;Stand    Examination-Participation Restrictions Cleaning;Community Activity;Laundry;Shop;Meal Prep    Stability/Clinical Decision Making Evolving/Moderate complexity    Clinical Decision Making Moderate    PT Frequency 2x / week    PT Duration 8 weeks    PT Treatment/Interventions ADLs/Self Care Home Management;Aquatic Therapy;Cryotherapy;Electrical Stimulation;Iontophoresis 4mg /ml Dexamethasone;Moist Heat;Ultrasound;Gait training;Stair training;Functional mobility training;Therapeutic activities;Therapeutic exercise;Balance training;Neuromuscular re-education;Patient/family education;Manual techniques;Passive range of motion;Taping    PT Next Visit Plan Continue to progress pt's stretching and strengthening of low back and hip as able. E-stim as needed. May benefit from aquatic therapy    PT Home Exercise Plan Access Code: [email or web address]    Recommended Other Services Aquatic therapy    Consulted and Agree with Plan of Care Patient           Patient will benefit from skilled therapeutic intervention in order to improve the following deficits and impairments:  Abnormal gait, Decreased range of motion, Difficulty walking, Increased fascial  restricitons, Decreased endurance, Obesity, Decreased activity tolerance, Pain, Decreased balance, Hypomobility, Impaired flexibility, Improper body mechanics, Postural dysfunction, Decreased strength, Decreased mobility  Visit Diagnosis: Chronic left-sided low back pain with left-sided sciatica  Muscle weakness (generalized)  Stiffness of left hip, not elsewhere classified  Other abnormalities of gait and mobility     Problem List There are no problems to display for this patient.   [facility] [address] PT, DPT 10/06/2019, 2:45 PM  [facility] [address] [place], [place], [address] Phone: [phone]   Fax:  [phone]  Name: Mariah Wiley MRN: [id number] Date of Birth: 1945/10/29

## 2019-10-08 ENCOUNTER — Other Ambulatory Visit: Payer: Self-pay

## 2019-10-08 ENCOUNTER — Ambulatory Visit: Payer: Federal, State, Local not specified - PPO | Admitting: Physical Therapy

## 2019-10-08 DIAGNOSIS — R2689 Other abnormalities of gait and mobility: Secondary | ICD-10-CM

## 2019-10-08 DIAGNOSIS — M5442 Lumbago with sciatica, left side: Secondary | ICD-10-CM | POA: Diagnosis not present

## 2019-10-08 DIAGNOSIS — M6281 Muscle weakness (generalized): Secondary | ICD-10-CM

## 2019-10-08 DIAGNOSIS — G8929 Other chronic pain: Secondary | ICD-10-CM

## 2019-10-08 DIAGNOSIS — M25652 Stiffness of left hip, not elsewhere classified: Secondary | ICD-10-CM

## 2019-10-08 NOTE — Therapy (Signed)
[facility] [address] [place], [place], [address] Phone: [phone]   Fax:  [phone]  Physical Therapy Treatment  Patient Details  Name: Mariah Wiley MRN: [id number] Date of Birth: 04-05-1946 Referring Provider (PT): Maudie Flakes, FNP   Encounter Date: 10/08/2019   PT End of Session - 10/08/19 1730    Visit Number 4    Number of Visits 16    Date for PT Re-Evaluation 11/17/19    PT Start Time 1705    PT Stop Time 1753    PT Time Calculation (min) 48 min    Activity Tolerance Patient tolerated treatment well;Patient limited by pain    Behavior During Therapy [facility] for tasks assessed/performed           Past Medical History:  Diagnosis Date  . Diabetes mellitus without complication (HCC)   . High cholesterol   . Hypertension   . Renal disorder     Past Surgical History:  Procedure Laterality Date  . CARPAL TUNNEL RELEASE      There were no vitals filed for this visit.   Subjective Assessment - 10/08/19 1749    Subjective Pt comes into clinic with no reports of pain and feeling better than last session. Pt states ionto patch worked well.    Limitations Sitting;Standing;Walking;House hold activities    How long can you sit comfortably? 15 minutes    How long can you stand comfortably? 30 sec    How long can you walk comfortably? 100'    Patient Stated Goals Decrease pain    Currently in Pain? No/denies    Pain Onset More than a month ago                             [facility] Adult PT Treatment/Exercise - 10/08/19 0001      Lumbar Exercises: Seated   Other Seated Lumbar Exercises TSA activation: with march x 10, with LAQ unilat x 10, seated clam shell x 10 with red tband, hip add with ball x 10 reps, alternating bilat shoulder flexion x 10 reps; alternating bilat shoulder & LAQ x 10 reps    Other Seated Lumbar Exercises Heel slides x 10 reps, heel/toe raises x 10 reps,       Shoulder  Exercises: Seated   Retraction Strengthening;10 reps    Other Seated Exercises Cervical retraction x 10 reps      Modalities   Modalities Iontophoresis      Electrical Stimulation   Electrical Stimulation Location Low back    Electrical Stimulation Action IFC    Electrical Stimulation Parameters to pt tolerance    Electrical Stimulation Goals Pain      Iontophoresis   Type of Iontophoresis Dexamethasone    Location low back    Dose 80 mA/min    Time 4 hrs                    PT Short Term Goals - 09/22/19 1836      PT SHORT TERM GOAL #1   Title Pt will be able to complete heel slide with full ROM and no pain    Baseline Knee ROM 30 to 100 deg without pain    Time 4    Period Weeks    Status New    Target Date 11/17/19             PT Long Term Goals - 09/22/19  1838      PT LONG TERM GOAL #1   Title Pt will be able to tolerate standing >/= 5 minutes without pain    Baseline Only tolerates 30 sec    Time 8    Period Weeks    Status New    Target Date 11/17/19      PT LONG TERM GOAL #2   Title Pt will be able to amb >/=200' with rollator without pain    Baseline Only tolerates ~100'      PT LONG TERM GOAL #3   Title Pt will be able to sweep her floor for 5 minutes without pain    Baseline Unable    Time 8    Period Weeks    Status New    Target Date 11/17/19      PT LONG TERM GOAL #4   Title Pt will have improved bilateral hip strength and ROM to at least [facility] for improved ADLs    Baseline L LE grossly 3- to 3+/5    Time 8    Period Weeks    Status New    Target Date 11/17/19      PT LONG TERM GOAL #5   Title Pt will be independent with her HEP    Time 8    Period Weeks    Status New    Target Date 11/17/19                 Plan - 10/08/19 1753    Clinical Impression Statement Treatment performed in sitting position with focus on continued abdominal bracing only due to pt's limited tolerance to other positions. Pt able to complete  heel slide without pain. Progressing pt to perform full LAQ and alternating marching without back pain/irritation.    Personal Factors and Comorbidities Age;Fitness;Comorbidity 1;Past/Current Experience    Comorbidities arthritis    Examination-Activity Limitations Bed Mobility;Bend;Caring for Others;Carry;Dressing;Lift;Locomotion Level;Sit;Squat;Stairs;Stand    Examination-Participation Restrictions Cleaning;Community Activity;Laundry;Shop;Meal Prep    Stability/Clinical Decision Making Evolving/Moderate complexity    Rehab Potential Fair    PT Frequency 2x / week    PT Duration 8 weeks    PT Treatment/Interventions ADLs/Self Care Home Management;Aquatic Therapy;Cryotherapy;Electrical Stimulation;Iontophoresis 4mg /ml Dexamethasone;Moist Heat;Ultrasound;Gait training;Stair training;Functional mobility training;Therapeutic activities;Therapeutic exercise;Balance training;Neuromuscular re-education;Patient/family education;Manual techniques;Passive range of motion;Taping    PT Next Visit Plan Continue to progress pt's stretching and strengthening of low back and hip as able. E-stim as needed. May benefit from aquatic therapy. Consider dynadisk if pt able to tolerate.    PT Home Exercise Plan Access Code: [email or web address]    Consulted and Agree with Plan of Care Patient           Patient will benefit from skilled therapeutic intervention in order to improve the following deficits and impairments:  Abnormal gait, Decreased range of motion, Difficulty walking, Increased fascial restricitons, Decreased endurance, Obesity, Decreased activity tolerance, Pain, Decreased balance, Hypomobility, Impaired flexibility, Improper body mechanics, Postural dysfunction, Decreased strength, Decreased mobility  Visit Diagnosis: Chronic left-sided low back pain with left-sided sciatica  Muscle weakness (generalized)  Stiffness of left hip, not elsewhere classified  Other abnormalities of gait and  mobility     Problem List There are no problems to display for this patient.   [facility] [address] PT, DPT 10/08/2019, 5:59 PM  [facility] [address] [place], [place], [address] Phone: [phone]   Fax:  [phone]  Name: Mariah Wiley MRN: [id number] Date of Birth: Feb 13, 1946

## 2019-10-13 ENCOUNTER — Ambulatory Visit: Payer: Federal, State, Local not specified - PPO | Admitting: Physical Therapy

## 2019-10-13 ENCOUNTER — Other Ambulatory Visit: Payer: Self-pay

## 2019-10-13 DIAGNOSIS — R2689 Other abnormalities of gait and mobility: Secondary | ICD-10-CM

## 2019-10-13 DIAGNOSIS — M25652 Stiffness of left hip, not elsewhere classified: Secondary | ICD-10-CM

## 2019-10-13 DIAGNOSIS — M5442 Lumbago with sciatica, left side: Secondary | ICD-10-CM | POA: Diagnosis not present

## 2019-10-13 DIAGNOSIS — M6281 Muscle weakness (generalized): Secondary | ICD-10-CM

## 2019-10-13 DIAGNOSIS — G8929 Other chronic pain: Secondary | ICD-10-CM

## 2019-10-13 NOTE — Therapy (Signed)
[facility] [address] [place], [place], [address] Phone: [phone]   Fax:  [phone]  Physical Therapy Treatment  Patient Details  Name: Mariah Wiley MRN: [id number] Date of Birth: 28-Apr-1945 Referring Provider (PT): Maudie Flakes, FNP   Encounter Date: 10/13/2019   PT End of Session - 10/13/19 1739    Visit Number 5    Number of Visits 16    Date for PT Re-Evaluation 11/17/19    PT Start Time 1705    PT Stop Time 1739    PT Time Calculation (min) 34 min    Activity Tolerance Patient limited by pain    Behavior During Therapy [facility] for tasks assessed/performed           Past Medical History:  Diagnosis Date  . Diabetes mellitus without complication (HCC)   . High cholesterol   . Hypertension   . Renal disorder     Past Surgical History:  Procedure Laterality Date  . CARPAL TUNNEL RELEASE      There were no vitals filed for this visit.   Subjective Assessment - 10/13/19 1712    Subjective Pt reports that ionto patch made her break out last session. Pt reports she was able to walk in the pool on Sunday and it felt okay. Pt states she has increased fatigue due to poor sleeping from mosquito/bug bites    Limitations Sitting;Standing;Walking;House hold activities    How long can you sit comfortably? 15 minutes    How long can you stand comfortably? 30 sec    How long can you walk comfortably? 100'    Patient Stated Goals Decrease pain    Pain Onset More than a month ago                             [facility] Adult PT Treatment/Exercise - 10/13/19 0001      Lumbar Exercises: Standing   Other Standing Lumbar Exercises standing into trunk in neutral x 5 reps      Lumbar Exercises: Seated   Sit to Stand 10 reps    Sit to Stand Limitations chair push-ups    Other Seated Lumbar Exercises TSA activation: with march x 10, with LAQ unilat x 10, seated clam shell x 10 with red tband, hip add with  ball x 10 reps, alternating bilat shoulder flexion x 10 reps; alternating bilat shoulder & LAQ x 10 reps, clamshell x 10 reps with red tband, ER and IR iso x 10 reps    sitting on airex   Other Seated Lumbar Exercises Heel raise 2 sets x 10 reps, lean forward/back x 10 reps                  PT Education - 10/13/19 1738    Education Details Discussed a few exercises in the pool.    Person(s) Educated Patient    Methods Explanation;Demonstration;Handout    Comprehension Verbalized understanding;Returned demonstration;Tactile cues required            PT Short Term Goals - 09/22/19 1836      PT SHORT TERM GOAL #1   Title Pt will be able to complete heel slide with full ROM and no pain    Baseline Knee ROM 30 to 100 deg without pain    Time 4    Period Weeks    Status New    Target Date 11/17/19  PT Long Term Goals - 09/22/19 1838      PT LONG TERM GOAL #1   Title Pt will be able to tolerate standing >/= 5 minutes without pain    Baseline Only tolerates 30 sec    Time 8    Period Weeks    Status New    Target Date 11/17/19      PT LONG TERM GOAL #2   Title Pt will be able to amb >/=200' with rollator without pain    Baseline Only tolerates ~100'      PT LONG TERM GOAL #3   Title Pt will be able to sweep her floor for 5 minutes without pain    Baseline Unable    Time 8    Period Weeks    Status New    Target Date 11/17/19      PT LONG TERM GOAL #4   Title Pt will have improved bilateral hip strength and ROM to at least [facility] for improved ADLs    Baseline L LE grossly 3- to 3+/5    Time 8    Period Weeks    Status New    Target Date 11/17/19      PT LONG TERM GOAL #5   Title Pt will be independent with her HEP    Time 8    Period Weeks    Status New    Target Date 11/17/19                 Plan - 10/13/19 1740    Clinical Impression Statement Treatment focused on progressing bilat LE, lumbar and core strengthening with pt sitting  on compliant surface. Progressing pt to transition/tolerate standing exercises. LAQ continues to "burn" pt's back.    Personal Factors and Comorbidities Age;Fitness;Comorbidity 1;Past/Current Experience    Comorbidities arthritis    Examination-Activity Limitations Bed Mobility;Bend;Caring for Others;Carry;Dressing;Lift;Locomotion Level;Sit;Squat;Stairs;Stand    Examination-Participation Restrictions Cleaning;Community Activity;Laundry;Shop;Meal Prep    Stability/Clinical Decision Making Evolving/Moderate complexity    Rehab Potential Fair    PT Frequency 2x / week    PT Duration 8 weeks    PT Treatment/Interventions ADLs/Self Care Home Management;Aquatic Therapy;Cryotherapy;Electrical Stimulation;Iontophoresis 4mg /ml Dexamethasone;Moist Heat;Ultrasound;Gait training;Stair training;Functional mobility training;Therapeutic activities;Therapeutic exercise;Balance training;Neuromuscular re-education;Patient/family education;Manual techniques;Passive range of motion;Taping    PT Next Visit Plan Continue to progress pt's stretching and strengthening of core, low back and hip as able. E-stim as needed. Progress pt into standing exercises as able.    PT Home Exercise Plan Access Code: [email or web address]    Consulted and Agree with Plan of Care Patient           Patient will benefit from skilled therapeutic intervention in order to improve the following deficits and impairments:  Abnormal gait, Decreased range of motion, Difficulty walking, Increased fascial restricitons, Decreased endurance, Obesity, Decreased activity tolerance, Pain, Decreased balance, Hypomobility, Impaired flexibility, Improper body mechanics, Postural dysfunction, Decreased strength, Decreased mobility  Visit Diagnosis: Chronic left-sided low back pain with left-sided sciatica  Muscle weakness (generalized)  Stiffness of left hip, not elsewhere classified  Other abnormalities of gait and mobility     Problem List There are no  problems to display for this patient.   [facility] [address] PT, DPT 10/13/2019, 5:45 PM  [facility] [address] [place], [place], [place] Phone: [phone]   Fax:  [phone]  Name: Mariah Wiley MRN: Gar Gibbon Date of Birth: 1946/03/01

## 2019-10-15 ENCOUNTER — Ambulatory Visit: Payer: Federal, State, Local not specified - PPO | Admitting: Rehabilitative and Restorative Service Providers"

## 2019-10-20 ENCOUNTER — Other Ambulatory Visit: Payer: Self-pay

## 2019-10-20 ENCOUNTER — Ambulatory Visit: Payer: Federal, State, Local not specified - PPO | Attending: Nurse Practitioner | Admitting: Physical Therapy

## 2019-10-20 DIAGNOSIS — M25652 Stiffness of left hip, not elsewhere classified: Secondary | ICD-10-CM | POA: Insufficient documentation

## 2019-10-20 DIAGNOSIS — R2689 Other abnormalities of gait and mobility: Secondary | ICD-10-CM | POA: Diagnosis present

## 2019-10-20 DIAGNOSIS — M5442 Lumbago with sciatica, left side: Secondary | ICD-10-CM | POA: Diagnosis present

## 2019-10-20 DIAGNOSIS — M6281 Muscle weakness (generalized): Secondary | ICD-10-CM | POA: Diagnosis present

## 2019-10-20 DIAGNOSIS — G8929 Other chronic pain: Secondary | ICD-10-CM | POA: Diagnosis present

## 2019-10-20 NOTE — Therapy (Signed)
[facility] Outpatient Rehabilitation [facility] [address] [place], [place], [address] Phone: [phone]   Fax:  [phone]  Physical Therapy Treatment  Patient Details  Name: Mariah Wiley MRN: [id number] Date of Birth: 04-18-1945 Referring Provider (PT): Maudie Flakes, FNP   Encounter Date: 10/20/2019   PT End of Session - 10/20/19 1537    Visit Number 6    Number of Visits 16    Date for PT Re-Evaluation 11/17/19    PT Start Time 1537    PT Stop Time 1620    PT Time Calculation (min) 43 min    Activity Tolerance Patient limited by pain    Behavior During Therapy [facility] for tasks assessed/performed           Past Medical History:  Diagnosis Date  . Diabetes mellitus without complication (HCC)   . High cholesterol   . Hypertension   . Renal disorder     Past Surgical History:  Procedure Laterality Date  . CARPAL TUNNEL RELEASE      There were no vitals filed for this visit.                      [facility] Adult PT Treatment/Exercise - 10/20/19 0001      Lumbar Exercises: Stretches   Other Lumbar Stretch Exercise quad stretch in sitting 2 sets x 30 sec bilat      Lumbar Exercises: Standing   Heel Raises 10 reps   leaning on mat table   Other Standing Lumbar Exercises standing into trunk in neutral x 5 reps      Lumbar Exercises: Seated   Long Arc Quad on Chair Strengthening;Both;10 reps    Sit to Stand 5 reps    Other Seated Lumbar Exercises TSA activation on dynadisk: with march x 10, with LAQ unilat x 10, seated clam shell x 10 with red tband, hip add with ball x 10 reps, alternating bilat shoulder flexion x 10 reps; alternating bilat shoulder & LAQ x 10 reps, 30 sec dynamic stabilization    Other Seated Lumbar Exercises Single leg push down on dyna disk 2 sets x 10 reps bilat      Electrical Stimulation   Electrical Stimulation Location Low back    Electrical Stimulation Action IFC    Electrical Stimulation Parameters to pt  tolerance    Electrical Stimulation Goals Pain                    PT Short Term Goals - 09/22/19 1836      PT SHORT TERM GOAL #1   Title Pt will be able to complete heel slide with full ROM and no pain    Baseline Knee ROM 30 to 100 deg without pain    Time 4    Period Weeks    Status New    Target Date 11/17/19             PT Long Term Goals - 09/22/19 1838      PT LONG TERM GOAL #1   Title Pt will be able to tolerate standing >/= 5 minutes without pain    Baseline Only tolerates 30 sec    Time 8    Period Weeks    Status New    Target Date 11/17/19      PT LONG TERM GOAL #2   Title Pt will be able to amb >/=200' with rollator without pain    Baseline Only tolerates ~100'  PT LONG TERM GOAL #3   Title Pt will be able to sweep her floor for 5 minutes without pain    Baseline Unable    Time 8    Period Weeks    Status New    Target Date 11/17/19      PT LONG TERM GOAL #4   Title Pt will have improved bilateral hip strength and ROM to at least [facility] for improved ADLs    Baseline L LE grossly 3- to 3+/5    Time 8    Period Weeks    Status New    Target Date 11/17/19      PT LONG TERM GOAL #5   Title Pt will be independent with her HEP    Time 8    Period Weeks    Status New    Target Date 11/17/19                 Plan - 10/20/19 1539    Clinical Impression Statement Treatment focused on increasing time in standing and attempting more standing exercises. Pt with difficulty tolerating complete weightshift/weightbearing on L knee due to pain when attempting standing marching and hip extension. Progressed core and lumbar stabilization exercises to pt sitting on dynadisk (pt previously using airex).    Personal Factors and Comorbidities Age;Fitness;Comorbidity 1;Past/Current Experience    Comorbidities arthritis    Examination-Activity Limitations Bed Mobility;Bend;Caring for Others;Carry;Dressing;Lift;Locomotion Level;Sit;Squat;Stairs;Stand      Examination-Participation Restrictions Cleaning;Community Activity;Laundry;Shop;Meal Prep    Stability/Clinical Decision Making Evolving/Moderate complexity    Rehab Potential Fair    PT Frequency 2x / week    PT Duration 8 weeks    PT Treatment/Interventions ADLs/Self Care Home Management;Aquatic Therapy;Cryotherapy;Electrical Stimulation;Iontophoresis 4mg /ml Dexamethasone;Moist Heat;Ultrasound;Gait training;Stair training;Functional mobility training;Therapeutic activities;Therapeutic exercise;Balance training;Neuromuscular re-education;Patient/family education;Manual techniques;Passive range of motion;Taping    PT Next Visit Plan Continue to progress pt's stretching and strengthening of core, low back and hip as able. E-stim as needed. Progress pt into standing exercises as able.    PT Home Exercise Plan Access Code: [email or web address]    Consulted and Agree with Plan of Care Patient           Patient will benefit from skilled therapeutic intervention in order to improve the following deficits and impairments:  Abnormal gait, Decreased range of motion, Difficulty walking, Increased fascial restricitons, Decreased endurance, Obesity, Decreased activity tolerance, Pain, Decreased balance, Hypomobility, Impaired flexibility, Improper body mechanics, Postural dysfunction, Decreased strength, Decreased mobility  Visit Diagnosis: Chronic left-sided low back pain with left-sided sciatica  Muscle weakness (generalized)  Stiffness of left hip, not elsewhere classified  Other abnormalities of gait and mobility     Problem List There are no problems to display for this patient.   [facility] [address] PT, DPT 10/20/2019, 4:14 PM  [facility] [address] [place], [place], [place] Phone: [phone]   Fax:  [phone]  Name: Mariah Wiley MRN: Gar Gibbon Date of Birth: [birth year]

## 2019-10-22 ENCOUNTER — Ambulatory Visit: Payer: Federal, State, Local not specified - PPO | Admitting: Physical Therapy

## 2019-10-27 ENCOUNTER — Ambulatory Visit: Payer: Federal, State, Local not specified - PPO | Admitting: Physical Therapy

## 2019-10-27 ENCOUNTER — Other Ambulatory Visit: Payer: Self-pay

## 2019-10-27 DIAGNOSIS — G8929 Other chronic pain: Secondary | ICD-10-CM

## 2019-10-27 DIAGNOSIS — R2689 Other abnormalities of gait and mobility: Secondary | ICD-10-CM

## 2019-10-27 DIAGNOSIS — M6281 Muscle weakness (generalized): Secondary | ICD-10-CM

## 2019-10-27 DIAGNOSIS — M5442 Lumbago with sciatica, left side: Secondary | ICD-10-CM | POA: Diagnosis not present

## 2019-10-27 DIAGNOSIS — M25652 Stiffness of left hip, not elsewhere classified: Secondary | ICD-10-CM
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
## 2019-10-29 ENCOUNTER — Other Ambulatory Visit: Payer: Self-pay

## 2019-10-29 ENCOUNTER — Ambulatory Visit: Payer: Federal, State, Local not specified - PPO | Admitting: Physical Therapy

## 2019-10-29 DIAGNOSIS — M25652 Stiffness of left hip, not elsewhere classified: Secondary | ICD-10-CM

## 2019-10-29 DIAGNOSIS — M5442 Lumbago with sciatica, left side: Secondary | ICD-10-CM | POA: Diagnosis not present

## 2019-10-29 DIAGNOSIS — R2689 Other abnormalities of gait and mobility: Secondary | ICD-10-CM

## 2019-10-29 DIAGNOSIS — M6281 Muscle weakness (generalized): Secondary | ICD-10-CM
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
## 2019-11-03 ENCOUNTER — Ambulatory Visit: Payer: Federal, State, Local not specified - PPO | Admitting: Physical Therapy

## 2019-11-03 ENCOUNTER — Other Ambulatory Visit: Payer: Self-pay

## 2019-11-03 DIAGNOSIS — M5442 Lumbago with sciatica, left side: Secondary | ICD-10-CM

## 2019-11-03 DIAGNOSIS — M25652 Stiffness of left hip, not elsewhere classified: Secondary | ICD-10-CM

## 2019-11-03 DIAGNOSIS — R2689 Other abnormalities of gait and mobility: Secondary | ICD-10-CM

## 2019-11-03 DIAGNOSIS — M6281 Muscle weakness (generalized): Secondary | ICD-10-CM

## 2019-11-03 NOTE — Therapy (Signed)
[facility] Outpatient Rehabilitation [facility] [address] [place], [place], [address] Phone: [phone]   Fax:  [phone]  Physical Therapy Treatment  Patient Details  Name: Mariah Wiley MRN: [id number] Date of Birth: 1946-03-12 Referring Provider (PT): Maudie Flakes, FNP   Encounter Date: 11/03/2019   PT End of Session - 11/03/19 1658    Visit Number 9    Number of Visits 16    Date for PT Re-Evaluation 11/17/19    PT Start Time 1620    PT Stop Time 1658    PT Time Calculation (min) 38 min    Activity Tolerance Patient limited by pain    Behavior During Therapy [facility] for tasks assessed/performed           Past Medical History:  Diagnosis Date  . Diabetes mellitus without complication (HCC)   . High cholesterol   . Hypertension   . Renal disorder     Past Surgical History:  Procedure Laterality Date  . CARPAL TUNNEL RELEASE      There were no vitals filed for this visit.   Subjective Assessment - 11/03/19 1626    Subjective Pt reports she still feels sore in her joints today.    Limitations Sitting;Standing;Walking;House hold activities    How long can you sit comfortably? 15 minutes    How long can you stand comfortably? 30 sec    How long can you walk comfortably? 100'    Patient Stated Goals Decrease pain    Currently in Pain? Yes    Pain Score 6     Pain Location Generalized    Pain Onset More than a month ago                             [facility] Adult PT Treatment/Exercise - 11/03/19 0001      Lumbar Exercises: Aerobic   Nustep L5 x 6 min      Lumbar Exercises: Standing   Other Standing Lumbar Exercises counter plank 2 x 10 sec, counter push-up x 10, counter plank with alternating UE flexion x 5 reps bilat      Lumbar Exercises: Seated   Other Seated Lumbar Exercises heel raise bilat x 10, trunk flexion against red tband x10, seated leg press against red tband x 10 reps, diagonal chops x 10 reps bilat with red  tband      Manual Therapy   Manual Therapy Soft tissue mobilization    Manual therapy comments STW Left quad    Soft tissue mobilization Left quad                    PT Short Term Goals - 09/22/19 1836      PT SHORT TERM GOAL #1   Title Pt will be able to complete heel slide with full ROM and no pain    Baseline Knee ROM 30 to 100 deg without pain    Time 4    Period Weeks    Status New    Target Date 11/17/19             PT Long Term Goals - 09/22/19 1838      PT LONG TERM GOAL #1   Title Pt will be able to tolerate standing >/= 5 minutes without pain    Baseline Only tolerates 30 sec    Time 8    Period Weeks    Status New  Target Date 11/17/19      PT LONG TERM GOAL #2   Title Pt will be able to amb >/=200' with rollator without pain    Baseline Only tolerates ~100'      PT LONG TERM GOAL #3   Title Pt will be able to sweep her floor for 5 minutes without pain    Baseline Unable    Time 8    Period Weeks    Status New    Target Date 11/17/19      PT LONG TERM GOAL #4   Title Pt will have improved bilateral hip strength and ROM to at least [facility] for improved ADLs    Baseline L LE grossly 3- to 3+/5    Time 8    Period Weeks    Status New    Target Date 11/17/19      PT LONG TERM GOAL #5   Title Pt will be independent with her HEP    Time 8    Period Weeks    Status New    Target Date 11/17/19                 Plan - 11/03/19 1705    Clinical Impression Statement Increased time in standing exercises this session. Pt fatigues quickly with increased L quad soreness after limited amount of time in standing. Continued core exercises in seated position.    Personal Factors and Comorbidities Age;Fitness;Comorbidity 1;Past/Current Experience    Comorbidities arthritis    Examination-Activity Limitations Bed Mobility;Bend;Caring for Others;Carry;Dressing;Lift;Locomotion Level;Sit;Squat;Stairs;Stand    Examination-Participation Restrictions  Cleaning;Community Activity;Laundry;Shop;Meal Prep    Stability/Clinical Decision Making Evolving/Moderate complexity    Rehab Potential Fair    PT Frequency 2x / week    PT Duration 8 weeks    PT Treatment/Interventions ADLs/Self Care Home Management;Aquatic Therapy;Cryotherapy;Electrical Stimulation;Iontophoresis 4mg /ml Dexamethasone;Moist Heat;Ultrasound;Gait training;Stair training;Functional mobility training;Therapeutic activities;Therapeutic exercise;Balance training;Neuromuscular re-education;Patient/family education;Manual techniques;Passive range of motion;Taping    PT Next Visit Plan Continue to progress pt's stretching and strengthening of core, low back and hip as able. E-stim as needed. Progress pt into standing exercises and trunk extension as able.    PT Home Exercise Plan Access Code: [email or web address]    Consulted and Agree with Plan of Care Patient           Patient will benefit from skilled therapeutic intervention in order to improve the following deficits and impairments:  Abnormal gait, Decreased range of motion, Difficulty walking, Increased fascial restricitons, Decreased endurance, Obesity, Decreased activity tolerance, Pain, Decreased balance, Hypomobility, Impaired flexibility, Improper body mechanics, Postural dysfunction, Decreased strength, Decreased mobility  Visit Diagnosis: Chronic left-sided low back pain with left-sided sciatica  Muscle weakness (generalized)  Stiffness of left hip, not elsewhere classified  Other abnormalities of gait and mobility     Problem List There are no problems to display for this patient.   Mariah Wiley April May 11/03/2019, 5:21 PM  [facility] [address] [place], [place], [place] Phone: [phone]   Fax:  [phone]  Name: Mariah Wiley MRN: Gar Gibbon Date of Birth: 1945/04/25

## 2019-11-05 ENCOUNTER — Other Ambulatory Visit: Payer: Self-pay

## 2019-11-05 ENCOUNTER — Ambulatory Visit: Payer: Federal, State, Local not specified - PPO | Admitting: Physical Therapy

## 2019-11-05 DIAGNOSIS — M25652 Stiffness of left hip, not elsewhere classified: Secondary | ICD-10-CM

## 2019-11-05 DIAGNOSIS — R2689 Other abnormalities of gait and mobility: Secondary | ICD-10-CM

## 2019-11-05 DIAGNOSIS — M5442 Lumbago with sciatica, left side: Secondary | ICD-10-CM

## 2019-11-05 DIAGNOSIS — M6281 Muscle weakness (generalized): Secondary | ICD-10-CM

## 2019-11-05 NOTE — Therapy (Addendum)
[place], [place], [address] Phone: [phone]   Fax:  [phone]  Physical Therapy Treatment and Hold/Discharge  Patient Details  Name: Cynai Skeens MRN: [id number] Date of Birth: September 25, 1945 Referring Provider (PT): Gregor Hams, FNP   Encounter Date: 11/05/2019   PT End of Session - 11/05/19 1707    Visit Number 10    Number of Visits 16    Date for PT Re-Evaluation 11/17/19    PT Start Time 1708    PT Stop Time 1746    PT Time Calculation (min) 38 min    Activity Tolerance Patient limited by pain    Behavior During Therapy [facility] for tasks assessed/performed           Past Medical History:  Diagnosis Date  . Diabetes mellitus without complication ([place])   . High cholesterol   . Hypertension   . Renal disorder     Past Surgical History:  Procedure Laterality Date  . CARPAL TUNNEL RELEASE      There were no vitals filed for this visit.   Subjective Assessment - 11/05/19 1711    Subjective Pt reports feeling less sore today but her L knee is hurting her. Pt reports pain is diffuse and localized in the joint    Limitations Sitting;Standing;Walking;House hold activities    How long can you sit comfortably? 15 minutes    How long can you stand comfortably? 30 sec    How long can you walk comfortably? 100'    Patient Stated Goals Decrease pain    Currently in Pain? Yes    Pain Score 7     Pain Location Knee    Pain Orientation Left    Pain Descriptors / Indicators Tightness;Throbbing    Pain Type Chronic pain    Pain Onset More than a month ago                             [facility] Adult PT Treatment/Exercise - 11/05/19 0001      Lumbar Exercises: Aerobic   Nustep L5 x 5 min      Lumbar Exercises: Seated   Other Seated Lumbar Exercises hip IR and ER with red tband x 10, ball squeeze x 10, bilat leg raise x 10, SLR x 10, quad set x 10, lat pull down 7# x 10, shoulder  extension x 10 reps, diagonals x 10 reps, trunk rotation x 10, shoulder ER x 10, side bend with cone touch x 10 reps (2 cones for R, 5 cones for L), resisted trunk rotation x 10 bilat (blue tband), free motion machine row _0 # x 10                  PT Education - 11/05/19 9476    Education Details Discussed holding PT for pt to save visits for aquatic therapy when it is available.    Person(s) Educated Patient    Methods Explanation;Demonstration;Handout    Comprehension Verbalized understanding;Returned demonstration;Verbal cues required;Tactile cues required            PT Short Term Goals - 11/05/19 1751      PT SHORT TERM GOAL #1   Title Pt will be able to complete heel slide with full ROM and no pain    Baseline Knee ROM 30 to 100 deg without pain    Time 4    Period Weeks    Status  Achieved    Target Date 11/17/19             PT Long Term Goals - 11/05/19 1752      PT LONG TERM GOAL #1   Title Pt will be able to tolerate standing >/= 5 minutes without pain    Baseline Only tolerates 30 sec    Time 8    Period Weeks    Status Not Met      PT LONG TERM GOAL #2   Title Pt will be able to amb >/=200' with rollator without pain    Baseline Only tolerates ~100'    Status Not Met      PT LONG TERM GOAL #3   Title Pt will be able to sweep her floor for 5 minutes without pain    Baseline Unable    Time 8    Period Weeks    Status Not Met      PT LONG TERM GOAL #4   Title Pt will have improved bilateral hip strength and ROM to at least [facility] for improved ADLs    Baseline L LE grossly 3- to 3+/5    Time 8    Period Weeks    Status Not Met      PT LONG TERM GOAL #5   Title Pt will be independent with her HEP    Time 8    Period Weeks    Status Not Met                 Plan - 11/05/19 1746    Clinical Impression Statement Pt with increased L knee pain during weight bearing this session. Performed treatment session in a seated position. Provided pt  with back and core exercises. Discussed with pt about holding PT until aquatic therapy is available as she has been making limited gains in a gravity resisted environment. Pt with decreased tolerance due to multi joint pain. Pt appears to have plateaued with traditional PT sessions. PT will hold therapy at this time.    Personal Factors and Comorbidities Age;Fitness;Comorbidity 1;Past/Current Experience    Comorbidities arthritis    Examination-Activity Limitations Bed Mobility;Bend;Caring for Others;Carry;Dressing;Lift;Locomotion Level;Sit;Squat;Stairs;Stand    Examination-Participation Restrictions Cleaning;Community Activity;Laundry;Shop;Meal Prep    Stability/Clinical Decision Making Evolving/Moderate complexity    Rehab Potential Fair    PT Frequency 2x / week    PT Duration 8 weeks    PT Treatment/Interventions ADLs/Self Care Home Management;Aquatic Therapy;Cryotherapy;Electrical Stimulation;Iontophoresis 46m/ml Dexamethasone;Moist Heat;Ultrasound;Gait training;Stair training;Functional mobility training;Therapeutic activities;Therapeutic exercise;Balance training;Neuromuscular re-education;Patient/family education;Manual techniques;Passive range of motion;Taping    PT Next Visit Plan Continue to progress pt's stretching and strengthening of core, low back and hip as able. E-stim as needed. Progress pt into standing exercises and trunk extension as able.    PT Home Exercise Plan Access Code: [email or web address]    Consulted and Agree with Plan of Care Patient           Patient will benefit from skilled therapeutic intervention in order to improve the following deficits and impairments:  Abnormal gait, Decreased range of motion, Difficulty walking, Increased fascial restricitons, Decreased endurance, Obesity, Decreased activity tolerance, Pain, Decreased balance, Hypomobility, Impaired flexibility, Improper body mechanics, Postural dysfunction, Decreased strength, Decreased mobility  Visit  Diagnosis: Chronic left-sided low back pain with left-sided sciatica  Muscle weakness (generalized)  Stiffness of left hip, not elsewhere classified  Other abnormalities of gait and mobility     Problem List There are no problems to display for this patient.  Kash Mothershead April Ma L Enoc Getter  PT, DPT 11/05/2019, 5:53 PM  [facility] [address] [place], [place], [address] Phone: [phone]   Fax:  [phone]  Name: Oasis Goehring MRN: [id number] Date of Birth: 09-15-1945  PHYSICAL THERAPY DISCHARGE SUMMARY  Visits from Start of Care: 10  Current functional level related to goals / functional outcomes: See above   Remaining deficits: See above   Education / Equipment: Anatomy of condition, POC, HEP, exercise form/rationale  Plan: Patient agrees to discharge.  Patient goals were not met. Patient is being discharged due to                                                     ?????    Reached a plateau, will require re-evaluation after time being out of PT. Jessica C. Hightower PT, DPT 02/16/20 11:01 AM
